# Patient Record
Sex: Male | Born: 1960 | Race: White | Hispanic: No | Marital: Married | Smoking: Never smoker
Health system: Southern US, Community
[De-identification: ages and names within clinical notes are randomized; demographics above are authoritative.]

## PROBLEM LIST (undated history)

## (undated) DIAGNOSIS — H269 Unspecified cataract: Secondary | ICD-10-CM

## (undated) DIAGNOSIS — R6889 Other general symptoms and signs: Secondary | ICD-10-CM

## (undated) DIAGNOSIS — T887XXA Unspecified adverse effect of drug or medicament, initial encounter: Secondary | ICD-10-CM

## (undated) DIAGNOSIS — E785 Hyperlipidemia, unspecified: Secondary | ICD-10-CM

## (undated) DIAGNOSIS — R7402 Elevation of levels of lactic acid dehydrogenase (LDH): Secondary | ICD-10-CM

## (undated) DIAGNOSIS — K209 Esophagitis, unspecified without bleeding: Secondary | ICD-10-CM

## (undated) DIAGNOSIS — K7689 Other specified diseases of liver: Secondary | ICD-10-CM

## (undated) DIAGNOSIS — K219 Gastro-esophageal reflux disease without esophagitis: Secondary | ICD-10-CM

## (undated) DIAGNOSIS — R740 Nonspecific elevation of levels of transaminase and lactic acid dehydrogenase [LDH]: Secondary | ICD-10-CM

## (undated) DIAGNOSIS — K449 Diaphragmatic hernia without obstruction or gangrene: Secondary | ICD-10-CM

## (undated) DIAGNOSIS — K5792 Diverticulitis of intestine, part unspecified, without perforation or abscess without bleeding: Secondary | ICD-10-CM

## (undated) DIAGNOSIS — L259 Unspecified contact dermatitis, unspecified cause: Secondary | ICD-10-CM

## (undated) DIAGNOSIS — K644 Residual hemorrhoidal skin tags: Secondary | ICD-10-CM

## (undated) DIAGNOSIS — R7401 Elevation of levels of liver transaminase levels: Secondary | ICD-10-CM

## (undated) HISTORY — DX: Residual hemorrhoidal skin tags: K64.4

## (undated) HISTORY — DX: Unspecified cataract: H26.9

## (undated) HISTORY — DX: Hyperlipidemia, unspecified: E78.5

## (undated) HISTORY — DX: Diaphragmatic hernia without obstruction or gangrene: K44.9

## (undated) HISTORY — DX: Elevation of levels of liver transaminase levels: R74.01

## (undated) HISTORY — DX: Other general symptoms and signs: R68.89

## (undated) HISTORY — PX: COLONOSCOPY: SHX174

## (undated) HISTORY — DX: Elevation of levels of lactic acid dehydrogenase (LDH): R74.02

## (undated) HISTORY — DX: Unspecified contact dermatitis, unspecified cause: L25.9

## (undated) HISTORY — DX: Gastro-esophageal reflux disease without esophagitis: K21.9

## (undated) HISTORY — DX: Unspecified adverse effect of drug or medicament, initial encounter: T88.7XXA

## (undated) HISTORY — DX: Esophagitis, unspecified: K20.9

## (undated) HISTORY — PX: CATARACT EXTRACTION, BILATERAL: SHX1313

## (undated) HISTORY — DX: Esophagitis, unspecified without bleeding: K20.90

## (undated) HISTORY — DX: Other specified diseases of liver: K76.89

## (undated) HISTORY — PX: WRIST SURGERY: SHX841

## (undated) HISTORY — DX: Nonspecific elevation of levels of transaminase and lactic acid dehydrogenase (ldh): R74.0

---

## 2000-12-24 ENCOUNTER — Other Ambulatory Visit: Admission: RE | Admit: 2000-12-24 | Discharge: 2000-12-24 | Payer: Self-pay | Admitting: Gastroenterology

## 2000-12-24 ENCOUNTER — Encounter (INDEPENDENT_AMBULATORY_CARE_PROVIDER_SITE_OTHER): Payer: Self-pay | Admitting: *Deleted

## 2002-12-20 ENCOUNTER — Inpatient Hospital Stay (HOSPITAL_COMMUNITY): Admission: EM | Admit: 2002-12-20 | Discharge: 2002-12-22 | Payer: Self-pay | Admitting: Emergency Medicine

## 2002-12-20 ENCOUNTER — Encounter: Payer: Self-pay | Admitting: *Deleted

## 2004-07-10 ENCOUNTER — Ambulatory Visit: Payer: Self-pay | Admitting: Gastroenterology

## 2005-08-31 ENCOUNTER — Ambulatory Visit: Payer: Self-pay | Admitting: Internal Medicine

## 2005-09-04 ENCOUNTER — Ambulatory Visit: Payer: Self-pay | Admitting: Internal Medicine

## 2006-01-18 ENCOUNTER — Ambulatory Visit: Payer: Self-pay | Admitting: Gastroenterology

## 2006-02-07 ENCOUNTER — Ambulatory Visit: Payer: Self-pay | Admitting: Gastroenterology

## 2006-02-07 ENCOUNTER — Encounter (INDEPENDENT_AMBULATORY_CARE_PROVIDER_SITE_OTHER): Payer: Self-pay | Admitting: *Deleted

## 2006-07-03 ENCOUNTER — Ambulatory Visit (HOSPITAL_COMMUNITY): Admission: EM | Admit: 2006-07-03 | Discharge: 2006-07-03 | Payer: Self-pay | Admitting: Emergency Medicine

## 2006-10-04 ENCOUNTER — Ambulatory Visit: Payer: Self-pay | Admitting: Gastroenterology

## 2006-10-16 ENCOUNTER — Ambulatory Visit: Payer: Self-pay | Admitting: Internal Medicine

## 2006-10-16 LAB — CONVERTED CEMR LAB
Albumin: 3.8 g/dL (ref 3.5–5.2)
Basophils Absolute: 0 10*3/uL (ref 0.0–0.1)
Bilirubin Urine: NEGATIVE
Cholesterol: 282 mg/dL (ref 0–200)
Creatinine, Ser: 1.2 mg/dL (ref 0.4–1.5)
Crystals: NEGATIVE
Direct LDL: 189.3 mg/dL
Eosinophils Absolute: 0.1 10*3/uL (ref 0.0–0.6)
HDL: 37.4 mg/dL — ABNORMAL LOW (ref >39.0)
Hemoglobin: 15.3 g/dL (ref 13.0–17.0)
Ketones, ur: NEGATIVE mg/dL
Leukocytes, UA: NEGATIVE
Lymphocytes Relative: 32.1 % (ref 12.0–46.0)
MCHC: 34.4 g/dL (ref 30.0–36.0)
MCV: 87 fL (ref 78.0–100.0)
Monocytes Absolute: 0.8 10*3/uL — ABNORMAL HIGH (ref 0.2–0.7)
Monocytes Relative: 14.5 % — ABNORMAL HIGH (ref 3.0–11.0)
Neutro Abs: 2.8 10*3/uL (ref 1.4–7.7)
Nitrite: NEGATIVE
Platelets: 200 10*3/uL (ref 150–400)
Potassium: 3.9 meq/L (ref 3.5–5.1)
Sodium: 139 meq/L (ref 135–145)
Specific Gravity, Urine: 1.025 (ref 1.000–1.03)
TSH: 2.05 microintl units/mL (ref 0.35–5.50)
Total Bilirubin: 0.9 mg/dL (ref 0.3–1.2)
Total Protein: 6.5 g/dL (ref 6.0–8.3)
Urine Glucose: NEGATIVE mg/dL
Urobilinogen, UA: 0.2 (ref 0.0–1.0)

## 2006-10-22 ENCOUNTER — Ambulatory Visit: Payer: Self-pay | Admitting: Internal Medicine

## 2006-11-24 ENCOUNTER — Emergency Department (HOSPITAL_COMMUNITY): Admission: EM | Admit: 2006-11-24 | Discharge: 2006-11-24 | Payer: Self-pay | Admitting: Emergency Medicine

## 2007-02-03 ENCOUNTER — Encounter: Payer: Self-pay | Admitting: Internal Medicine

## 2007-02-03 DIAGNOSIS — E785 Hyperlipidemia, unspecified: Secondary | ICD-10-CM | POA: Insufficient documentation

## 2007-04-02 ENCOUNTER — Ambulatory Visit: Payer: Self-pay

## 2007-04-02 ENCOUNTER — Encounter: Payer: Self-pay | Admitting: Internal Medicine

## 2007-05-08 HISTORY — PX: LEG SURGERY: SHX1003

## 2007-12-29 DIAGNOSIS — K449 Diaphragmatic hernia without obstruction or gangrene: Secondary | ICD-10-CM | POA: Insufficient documentation

## 2007-12-29 DIAGNOSIS — K644 Residual hemorrhoidal skin tags: Secondary | ICD-10-CM | POA: Insufficient documentation

## 2007-12-29 DIAGNOSIS — K219 Gastro-esophageal reflux disease without esophagitis: Secondary | ICD-10-CM | POA: Insufficient documentation

## 2007-12-30 ENCOUNTER — Ambulatory Visit: Payer: Self-pay | Admitting: Gastroenterology

## 2007-12-30 DIAGNOSIS — T887XXA Unspecified adverse effect of drug or medicament, initial encounter: Secondary | ICD-10-CM | POA: Insufficient documentation

## 2007-12-30 DIAGNOSIS — R740 Nonspecific elevation of levels of transaminase and lactic acid dehydrogenase [LDH]: Secondary | ICD-10-CM

## 2007-12-30 DIAGNOSIS — R7401 Elevation of levels of liver transaminase levels: Secondary | ICD-10-CM | POA: Insufficient documentation

## 2007-12-30 LAB — CONVERTED CEMR LAB
Ceruloplasmin: 37 mg/dL (ref 21–63)
HCV Ab: NEGATIVE

## 2007-12-31 ENCOUNTER — Encounter: Payer: Self-pay | Admitting: Gastroenterology

## 2008-01-01 ENCOUNTER — Ambulatory Visit (HOSPITAL_COMMUNITY): Admission: RE | Admit: 2008-01-01 | Discharge: 2008-01-01 | Payer: Self-pay | Admitting: Gastroenterology

## 2008-04-06 LAB — CONVERTED CEMR LAB
AST: 28 units/L (ref 0–37)
Alkaline Phosphatase: 59 units/L (ref 39–117)
Ammonia: 16 umol/L (ref 11–35)
Basophils Absolute: 0 10*3/uL (ref 0.0–0.1)
Chloride: 105 meq/L (ref 96–112)
Eosinophils Absolute: 0.1 10*3/uL (ref 0.0–0.7)
GFR calc non Af Amer: 69 mL/min
MCHC: 35 g/dL (ref 30.0–36.0)
MCV: 88.8 fL (ref 78.0–100.0)
Neutrophils Relative %: 55 % (ref 43.0–77.0)
Platelets: 243 10*3/uL (ref 150–400)
Potassium: 4.1 meq/L (ref 3.5–5.1)
Sodium: 141 meq/L (ref 135–145)
Total Bilirubin: 1.2 mg/dL (ref 0.3–1.2)
WBC: 5.8 10*3/uL (ref 4.5–10.5)

## 2008-04-19 ENCOUNTER — Ambulatory Visit: Payer: Self-pay | Admitting: Gastroenterology

## 2008-04-19 LAB — CONVERTED CEMR LAB
Bilirubin, Direct: 0.1 mg/dL (ref 0.0–0.3)
Total Bilirubin: 0.9 mg/dL (ref 0.3–1.2)
Total Protein: 6.8 g/dL (ref 6.0–8.3)

## 2008-04-22 ENCOUNTER — Ambulatory Visit: Payer: Self-pay | Admitting: Gastroenterology

## 2008-04-22 DIAGNOSIS — L259 Unspecified contact dermatitis, unspecified cause: Secondary | ICD-10-CM | POA: Insufficient documentation

## 2008-04-22 DIAGNOSIS — K7689 Other specified diseases of liver: Secondary | ICD-10-CM | POA: Insufficient documentation

## 2008-04-22 LAB — CONVERTED CEMR LAB
PSA: 3.13 ng/mL (ref 0.10–4.00)
Sex Hormone Binding: 23 nmol/L (ref 13–71)
Testosterone: 306.8 ng/dL — ABNORMAL LOW (ref 350–890)
Tissue Transglutaminase Ab, IgA: 0 units (ref <7)

## 2008-05-11 ENCOUNTER — Encounter: Payer: Self-pay | Admitting: Gastroenterology

## 2008-05-14 ENCOUNTER — Encounter: Payer: Self-pay | Admitting: Gastroenterology

## 2008-05-14 ENCOUNTER — Ambulatory Visit: Payer: Self-pay | Admitting: Gastroenterology

## 2008-05-17 ENCOUNTER — Telehealth (INDEPENDENT_AMBULATORY_CARE_PROVIDER_SITE_OTHER): Payer: Self-pay | Admitting: *Deleted

## 2008-05-19 ENCOUNTER — Encounter: Payer: Self-pay | Admitting: Gastroenterology

## 2008-06-15 ENCOUNTER — Ambulatory Visit: Payer: Self-pay | Admitting: Gastroenterology

## 2008-06-15 DIAGNOSIS — R6889 Other general symptoms and signs: Secondary | ICD-10-CM

## 2008-07-06 ENCOUNTER — Encounter: Admission: RE | Admit: 2008-07-06 | Discharge: 2008-07-06 | Payer: Self-pay | Admitting: Orthopedic Surgery

## 2008-07-16 ENCOUNTER — Inpatient Hospital Stay (HOSPITAL_COMMUNITY): Admission: RE | Admit: 2008-07-16 | Discharge: 2008-07-18 | Payer: Self-pay | Admitting: Orthopedic Surgery

## 2008-12-21 ENCOUNTER — Ambulatory Visit: Payer: Self-pay | Admitting: Gastroenterology

## 2008-12-21 LAB — CONVERTED CEMR LAB
ALT: 41 units/L (ref 0–53)
Albumin: 3.7 g/dL (ref 3.5–5.2)
Alkaline Phosphatase: 67 units/L (ref 39–117)
Total Protein: 6.7 g/dL (ref 6.0–8.3)

## 2009-11-21 ENCOUNTER — Ambulatory Visit: Payer: Self-pay | Admitting: Internal Medicine

## 2009-11-22 LAB — CONVERTED CEMR LAB
Albumin: 4 g/dL (ref 3.5–5.2)
Basophils Absolute: 0 10*3/uL (ref 0.0–0.1)
Calcium: 9.3 mg/dL (ref 8.4–10.5)
Chloride: 106 meq/L (ref 96–112)
Cholesterol: 243 mg/dL — ABNORMAL HIGH (ref 0–200)
Creatinine, Ser: 1.2 mg/dL (ref 0.4–1.5)
Direct LDL: 170 mg/dL
Eosinophils Absolute: 0.1 10*3/uL (ref 0.0–0.7)
GFR calc non Af Amer: 69.45 mL/min (ref >60)
HCT: 43.4 % (ref 39.0–52.0)
Hemoglobin: 15.2 g/dL (ref 13.0–17.0)
Leukocytes, UA: NEGATIVE
Lymphs Abs: 1.7 10*3/uL (ref 0.7–4.0)
MCHC: 35 g/dL (ref 30.0–36.0)
MCV: 88.3 fL (ref 78.0–100.0)
Monocytes Absolute: 0.7 10*3/uL (ref 0.1–1.0)
Neutro Abs: 3.1 10*3/uL (ref 1.4–7.7)
Nitrite: NEGATIVE
Platelets: 198 10*3/uL (ref 150.0–400.0)
RDW: 13 % (ref 11.5–14.6)
Specific Gravity, Urine: 1.025 (ref 1.000–1.030)
TSH: 2.05 microintl units/mL (ref 0.35–5.50)
Total Bilirubin: 0.6 mg/dL (ref 0.3–1.2)
Total CHOL/HDL Ratio: 6
Triglycerides: 223 mg/dL — ABNORMAL HIGH (ref 0.0–149.0)
Urobilinogen, UA: 0.2 (ref 0.0–1.0)
pH: 6 (ref 5.0–8.0)

## 2009-11-28 ENCOUNTER — Ambulatory Visit: Payer: Self-pay | Admitting: Internal Medicine

## 2009-11-28 DIAGNOSIS — R972 Elevated prostate specific antigen [PSA]: Secondary | ICD-10-CM

## 2010-01-27 ENCOUNTER — Ambulatory Visit: Payer: Self-pay | Admitting: Internal Medicine

## 2010-04-25 ENCOUNTER — Ambulatory Visit: Payer: Self-pay | Admitting: Gastroenterology

## 2010-05-07 DIAGNOSIS — K5792 Diverticulitis of intestine, part unspecified, without perforation or abscess without bleeding: Secondary | ICD-10-CM

## 2010-05-07 HISTORY — DX: Diverticulitis of intestine, part unspecified, without perforation or abscess without bleeding: K57.92

## 2010-06-04 LAB — CONVERTED CEMR LAB
PSA, Free Pct: 25 (ref >25)
PSA: 2.85 ng/mL (ref 0.10–4.00)

## 2010-06-06 NOTE — Assessment & Plan Note (Signed)
Summary: cpx-lb   Vital Signs:  Patient profile:   50 year old male Height:      67 inches Weight:      198 pounds BMI:     31.12 O2 Sat:      96 % on Room air Temp:     98.6 degrees F oral Pulse rate:   72 / minute Pulse rhythm:   regular Resp:     16 per minute BP sitting:   130 / 80  (left arm) Cuff size:   regular  Vitals Entered By: Lanier Prude, CMA(AAMA) (November 28, 2009 2:05 PM)  O2 Flow:  Room air CC: CPX Is Patient Diabetic? No   Primary Care Provider:  Sonda Primes, MD  CC:  CPX.  History of Present Illness: The patient presents for a wellness examination The patient presents for a follow up of hyperlipidemia, fatty liver, abn tests    Current Medications (verified): 1)  Nexium 40 Mg  Cpdr (Esomeprazole Magnesium) .... Take 1 Tablet By Mouth Once A Day 2)  Baby Aspirin 81 Mg  Chew (Aspirin) .... Take 4 Capsules Once Daily 3)  Citrucel 500 Mg  Tabs (Methylcellulose (Laxative)) .... Once Daily 4)  Align   Caps (Misc Intestinal Flora Regulat) .... Take One Capsule By Mouth Daily  Allergies (verified): 1)  ! Penicillin V Potassium (Penicillin V Potassium) 2)  ! Zocor (Simvastatin) 3)  ! Niacin 4)  ! Zetia (Ezetimibe) 5)  ! Welchol (Colesevelam Hcl) 6)  ! 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  Alcohol Use - yes-rare Daily Caffeine Use-1-2 cups daily Illicit Drug Use - no Patient does not get regular exercise.  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  Alcohol Use - yes-rare Daily Caffeine Use-1-2 cups daily Illicit Drug Use - no Patient does not get regular exercise.  Married 2 children  Review of Systems  The patient denies anorexia, fever, weight loss, weight gain, vision loss, decreased hearing, hoarseness, chest pain, syncope, dyspnea on exertion, peripheral edema, prolonged cough, headaches, hemoptysis, abdominal pain, melena, hematochezia, severe indigestion/heartburn, hematuria, incontinence, genital sores, muscle weakness, suspicious skin lesions, transient blindness, difficulty walking, depression, unusual weight change, abnormal bleeding, enlarged lymph nodes, angioedema, and testicular masses.    Physical Exam  General:  Well developed, well nourished, no acute distress.healthy appearing.   Head:  Normocephalic and atraumatic without obvious abnormalities. No apparent alopecia or balding. Eyes:  No corneal or conjunctival inflammation noted. EOMI. Perrla.  Ears:  External ear exam shows no significant lesions or deformities.  Otoscopic examination reveals clear canals, tympanic membranes are intact bilaterally without bulging, retraction, inflammation or discharge. Hearing is grossly normal bilaterally. Nose:  External nasal examination shows no deformity or inflammation. Nasal mucosa are pink and moist without lesions or exudates. Mouth:  Oral mucosa and oropharynx without lesions or exudates.  Teeth in good repair. Neck:  Supple; no masses or thyromegaly.  Chest Wall:  No deformities, masses, tenderness or gynecomastia noted. Lungs:  Normal respiratory effort, chest expands symmetrically. Lungs are clear to auscultation, no  crackles or wheezes. Heart:  Normal rate and regular rhythm. S1 and S2 normal without gallop, murmur, click, rub or other extra sounds. Abdomen:  Bowel sounds positive,abdomen soft and non-tender without masses, organomegaly or hernias noted. Rectal:  No external abnormalities noted. Normal sphincter tone. No rectal masses or tenderness. Genitalia:  Testes bilaterally descended without nodularity, tenderness or masses. No scrotal masses or lesions. No penis lesions or urethral discharge. Prostate:  Prostate gland firm and smooth, no enlargement, nodularity, tenderness, mass, asymmetry or induration. Msk:  No deformity or scoliosis noted of thoracic or lumbar spine.   Pulses:  R and L carotid,radial,femoral,dorsalis pedis and posterior tibial pulses are full and equal bilaterally Extremities:  No clubbing, cyanosis, edema, or deformity noted with normal full range of motion of all joints.   Neurologic:  No cranial nerve deficits noted. Station and gait are normal. Plantar reflexes are down-going bilaterally. DTRs are symmetrical throughout. Sensory, motor and coordinative functions appear intact. Skin:  Intact without suspicious lesions or rashes Cervical Nodes:  No lymphadenopathy noted Inguinal Nodes:  No significant adenopathy Psych:  Cognition and judgment appear intact. Alert and cooperative with normal attention span and concentration. No apparent delusions, illusions, hallucinations   Impression & Recommendations:  Problem # 1:  PHYSICAL EXAMINATION (ICD-V70.0) Assessment New Health and age related issues were discussed. Available screening tests and vaccinations were discussed as well. Healthy life style including good diet and execise was discussed.  The labs were reviewed with the patient.  EKG ok Colon due 2013  Problem # 2:  PSA, INCREASED (ICD-790.93) Assessment: New Discussed. Start antibiotic.Marland Kitchen Recheck in 3 months with  a free PSA   Problem # 3:  FATTY LIVER DISEASE  (ICD-571.8) Assessment: Comment Only Loosse wt  Problem # 4:  GERD (ICD-530.81) Assessment: Improved  His updated medication list for this problem includes:    Nexium 40 Mg Cpdr (Esomeprazole magnesium) .Marland Kitchen... Take 1 tablet by mouth once a day  Problem # 5:  HYPERLIPIDEMIA (ICD-272.4) Assessment: Comment Only NMR Statin intol. Labs Reviewed: SGOT: 26 (11/21/2009)   SGPT: 48 (11/21/2009)   HDL:39.10 (11/21/2009), 37.4 (10/16/2006)  LDL:DEL (10/16/2006)  Chol:243 (11/21/2009), 282 (10/16/2006)  Trig:223.0 (11/21/2009), 127 (10/16/2006)  Complete Medication List: 1)  Nexium 40 Mg Cpdr (Esomeprazole magnesium) .... Take 1 tablet by mouth once a day 2)  Citrucel 500 Mg Tabs (Methylcellulose (laxative)) .... Once daily 3)  Align Caps (Misc intestinal flora regulat) .... Take one capsule by mouth daily 4)  Vitamin D 1000 Unit Tabs (Cholecalciferol) .Marland Kitchen.. 1 by mouth qd 5)  Ciprofloxacin Hcl 500 Mg Tabs (Ciprofloxacin hcl) .Marland Kitchen.. 1 by mouth bid 6)  Aspirin 325 Mg Tabs (Aspirin) .Marland Kitchen.. 1 by mouth once daily pc 7)  Triamcinolone Acetonide 0.5 % Crea (Triamcinolone acetonide) .... Use two times a day prn  Other Orders: EKG w/ Interpretation (93000)  Patient Instructions: 1)  Please schedule a follow-up appointment in 2-3 months. 2)  PSA prior to visit, ICD-9 and free PSA  995.20 3)  NMR lipid profile 272.0 4)  Try to eat more raw plant food, fresh and dry fruit, raw almonds, leafy vegetables, whole foods and less red meat, less animal fat. Poultry and fish is better for you than pork and beef. Avoid processed foods (canned soups, hot dogs, sausage, bacon , frozen dinners). Avoid corn syrup, high fructose syrup or  aspartam  containing drinks. Honey, Agave and Stevia are better sweeteners. Make your own  dressing with olive oil, wine vinegar, lemon juce, garlic etc. for your salads.  5)  Please schedule a follow-up appointment in 1 year well w/labs. Prescriptions: TRIAMCINOLONE ACETONIDE 0.5 % CREA  (TRIAMCINOLONE ACETONIDE) use two times a day prn  #120 g x 3   Entered and Authorized by:   Tresa Garter MD   Signed by:   Tresa Garter MD on 11/28/2009   Method used:   Electronically to        [facility] # [phone]* (retail)       [address]       [place], [place]  [address]       Ph: [phone]       Fax: [phone]   RxID:   [id number] CIPROFLOXACIN HCL 500 MG TABS (CIPROFLOXACIN HCL) 1 by mouth bid  #28 x 0   Entered and Authorized by:   Tresa Garter MD   Signed by:   Tresa Garter MD on 11/28/2009   Method used:   Electronically to        [facility] # [phone]* (retail)       [address]       [place], [place]  [address]       Ph: [phone]       Fax: [phone]   RxID:   [id number]

## 2010-06-08 NOTE — Assessment & Plan Note (Signed)
Summary: REFLUX.Marland KitchenJJ.    History of Present Illness Visit Type: Follow-up Visit Primary GI MD: Sheryn Bison MD FACP FAGA Primary Gianny Sabino: Sonda Primes, MD Chief Complaint: GERD, lump in throat & dysphagia x 30  days History of Present Illness:   Very Pleasant 50 year old Caucasian male with a rather typical globus sensation his retropharyngeal area of the last 6 weeks unrelieved by Nexium 40 mg a day which she's been taking for several years.Previous endoscopic exam in January 2010 showed a small hiatal hernia and resolving esophagitis. Examination for H. pylori was negative.  He denies dysphagia,anorexia, weight loss, postnasal drip, allergies, cough, asthma, frequent antibiotic use. He denies any lower gastrointestinal hepatobiliary complaints. He is on daily aspirin therapy.   GI Review of Systems    Reports acid reflux and  dysphagia with solids.      Denies abdominal pain, belching, bloating, chest pain, dysphagia with liquids, heartburn, loss of appetite, nausea, vomiting, vomiting blood, weight loss, and  weight gain.        Denies anal fissure, black tarry stools, change in bowel habit, constipation, diarrhea, diverticulosis, fecal incontinence, heme positive stool, hemorrhoids, irritable bowel syndrome, jaundice, light color stool, liver problems, rectal bleeding, and  rectal pain.    Current Medications (verified): 1)  Nexium 40 Mg  Cpdr (Esomeprazole Magnesium) .... Take 1 Tablet By Mouth Once A Day 2)  Citrucel 500 Mg  Tabs (Methylcellulose (Laxative)) .... Once Daily 3)  Aspirin 325 Mg Tabs (Aspirin) .Marland Kitchen.. 1 By Mouth Once Daily Pc 4)  Triamcinolone Acetonide 0.5 % Crea (Triamcinolone Acetonide) .... Use Two Times A Day Prn  Allergies (verified): 1)  ! Penicillin V Potassium (Penicillin V Potassium) 2)  ! Zocor (Simvastatin) 3)  ! Niacin 4)  ! Zetia (Ezetimibe) 5)  ! Welchol (Colesevelam Hcl) 6)  ! Prilosec  Past History:  Past medical, surgical, family and  social histories (including risk factors) reviewed for relevance to current acute and chronic problems.  Past Medical History: Reviewed history from 11/28/2009 and no changes required. Current Problems:  OTHER SYMPTOMS INVOLVING HEAD AND NECK (ICD-784.99) CONTACT DERMATITIS&OTHER ECZEMA DUE UNSPEC CAUSE (ICD-692.9) FATTY LIVER DISEASE (ICD-571.8) ADVERSE DRUG REACTION (ICD-995.20) - statin intolerant NONSPEC ELEVATION OF LEVELS OF TRANSAMINASE/LDH (ICD-790.4) GERD (ICD-530.81) HIATAL HERNIA (ICD-553.3) EXTERNAL HEMORRHOIDS (ICD-455.3) HYPERLIPIDEMIA (ICD-272.4)  Past Surgical History: surgery with metal plate left wrist and index finger Broken Leg  Family History: Reviewed history from 12/30/2007 and no changes required. No FH of Colon Cancer: Family History of Diabetes: Father, Paternal Emelia Loron, Paternal Elio Forget  Social History: Reviewed history from 11/28/2009 and no changes required. 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  Alcohol Use - yes-rare Daily Caffeine Use-1-2 cups daily Illicit Drug Use - no Patient does not get regular exercise.  Married 2 children  Review of Systems  The patient denies allergy/sinus, anemia, anxiety-new, arthritis/joint pain, back pain, blood in urine, breast changes/lumps, change in vision, confusion, cough, coughing up blood, depression-new, fainting, fatigue, fever, headaches-new, hearing problems, heart murmur, heart rhythm changes, itching, menstrual pain, muscle pains/cramps, night sweats, nosebleeds, pregnancy symptoms, shortness of breath, skin rash, sleeping problems, sore throat, swelling of feet/legs, swollen lymph glands, thirst - excessive , urination - excessive , urination changes/pain, urine leakage, vision changes, and voice change.    Vital Signs:  Patient profile:   50 year old male Height:      67 inches Weight:      198.13 pounds BMI:     31.14 Pulse rate:   72 /  minute Pulse rhythm:   regular BP  sitting:   100 / 70  (left arm) Cuff size:   regular  Vitals Entered By: June McMurray CMA Duncan Dull) (April 25, 2010 3:06 PM)  Physical Exam  General:  Well developed, well nourished, no acute distress.healthy appearing.   Head:  Normocephalic and atraumatic. Eyes:  PERRLA, no icterus.exam deferred to patient's ophthalmologist.   Mouth:  No deformity or lesions, dentition normal. Neck:  Supple; no masses or thyromegaly. Lungs:  Clear throughout to auscultation. Heart:  Regular rate and rhythm; no murmurs, rubs,  or bruits. Abdomen:  Soft, nontender and nondistended. No masses, hepatosplenomegaly or hernias noted. Normal bowel sounds. Psych:  Alert and cooperative. Normal mood and affect.   Impression & Recommendations:  Problem # 1:  GERD (ICD-530.81) Assessment Deteriorated Extra esophageal manifestation of acid reflux with rather typical globus sensation. He denies any ENT problems or psychiatric difficulties. I placed him on Nexium 40 mg twice a day with p.r.n. sublingual Levsin. We'll long discussion concerning acid reflux and atypical presentations of this condition. I will see him back in 6 weeks' time for followup and consider esophageal manometry and 24-hour pH probe testing depending on his clinical course. He of course is to follow standard antireflux maneuvers. There is nothing in his history  to suggest eosinophilic esophagitis.  Problem # 2:  FATTY LIVER DISEASE (ICD-571.8) Assessment: Improved  Rrepeat liver function tests were normal and there is no evidence of hepatosplenomegaly on physical exam or stigmata of chronic liver disease.  Patient Instructions: 1)  Please pick up your prescriptions at the pharmacy. Electronic prescription(s) has already been sent for Nexium two times a day dosing and Levsin every 6-8 hours as needed. 2)  Please schedule a follow-up appointment in 6 weeks.  3)  Copy sent to : Dr A Plotnikov 4)  The medication list was reviewed and  reconciled.  All changed / newly prescribed medications were explained.  A complete medication list was provided to the patient / caregiver. Prescriptions: LEVSIN 0.125 MG TABS (HYOSCYAMINE SULFATE) Take 1 tablet by mouth every 6-8 hours as needed  #30 x 2   Entered by:   Lamona Curl CMA (AAMA)   Authorized by:   Mardella Layman MD [facility]   Signed by:   Lamona Curl CMA (AAMA) on 04/25/2010   Method used:   Electronically to        [facility] # [phone]* (retail)       [address]       [place], [place]  [address]       Ph: [phone]       Fax: [phone]   RxID:   [id number] NEXIUM 40 MG  CPDR (ESOMEPRAZOLE MAGNESIUM) Take 1 tablet by mouth two times a day (30 minutes before breakfast, 30 minutes before dinner).  #60 x 2   Entered by:   Lamona Curl CMA (AAMA)   Authorized by:   Mardella Layman MD [facility]   Signed by:   Lamona Curl CMA (AAMA) on 04/25/2010   Method used:   Electronically to        [facility] # [phone]* (retail)       [address]       [place], [place]  [address]       Ph: [phone]       Fax: [phone]   RxID:   [id number]

## 2010-08-17 LAB — DIFFERENTIAL
Basophils Absolute: 0 10*3/uL (ref 0.0–0.1)
Basophils Relative: 1 % (ref 0–1)
Lymphocytes Relative: 19 % (ref 12–46)
Neutro Abs: 4.6 10*3/uL (ref 1.7–7.7)
Neutrophils Relative %: 68 % (ref 43–77)

## 2010-08-17 LAB — URINALYSIS, ROUTINE W REFLEX MICROSCOPIC
Glucose, UA: NEGATIVE mg/dL
Ketones, ur: NEGATIVE mg/dL
Nitrite: NEGATIVE
Protein, ur: NEGATIVE mg/dL
Urobilinogen, UA: 0.2 mg/dL (ref 0.0–1.0)

## 2010-08-17 LAB — BASIC METABOLIC PANEL
BUN: 14 mg/dL (ref 6–23)
Chloride: 98 mEq/L (ref 96–112)
Chloride: 99 mEq/L (ref 96–112)
Creatinine, Ser: 1.11 mg/dL (ref 0.4–1.5)
GFR calc non Af Amer: >60 mL/min (ref >60)
Glucose, Bld: 130 mg/dL — ABNORMAL HIGH (ref 70–99)
Glucose, Bld: 143 mg/dL — ABNORMAL HIGH (ref 70–99)
Potassium: 3.4 mEq/L — ABNORMAL LOW (ref 3.5–5.1)
Potassium: 4.4 mEq/L (ref 3.5–5.1)
Sodium: 137 mEq/L (ref 135–145)

## 2010-08-17 LAB — COMPREHENSIVE METABOLIC PANEL
Albumin: 3.6 g/dL (ref 3.5–5.2)
Alkaline Phosphatase: 77 U/L (ref 39–117)
BUN: 17 mg/dL (ref 6–23)
Chloride: 101 mEq/L (ref 96–112)
Creatinine, Ser: 1.08 mg/dL (ref 0.4–1.5)
GFR calc non Af Amer: >60 mL/min (ref >60)
Glucose, Bld: 95 mg/dL (ref 70–99)
Potassium: 4.1 mEq/L (ref 3.5–5.1)
Total Bilirubin: 1 mg/dL (ref 0.3–1.2)

## 2010-08-17 LAB — PROTIME-INR
INR: 1 (ref 0.00–1.49)
Prothrombin Time: 13.2 seconds (ref 11.6–15.2)

## 2010-08-17 LAB — CBC
HCT: 30.8 % — ABNORMAL LOW (ref 39.0–52.0)
HCT: 33.3 % — ABNORMAL LOW (ref 39.0–52.0)
HCT: 38.5 % — ABNORMAL LOW (ref 39.0–52.0)
Hemoglobin: 11.6 g/dL — ABNORMAL LOW (ref 13.0–17.0)
Hemoglobin: 13.6 g/dL (ref 13.0–17.0)
MCV: 87.1 fL (ref 78.0–100.0)
MCV: 88.3 fL (ref 78.0–100.0)
MCV: 88.8 fL (ref 78.0–100.0)
Platelets: 295 10*3/uL (ref 150–400)
Platelets: 336 10*3/uL (ref 150–400)
RDW: 12.5 % (ref 11.5–15.5)
RDW: 12.6 % (ref 11.5–15.5)
WBC: 6.8 10*3/uL (ref 4.0–10.5)

## 2010-08-17 LAB — URINE CULTURE

## 2010-08-22 ENCOUNTER — Ambulatory Visit (INDEPENDENT_AMBULATORY_CARE_PROVIDER_SITE_OTHER): Payer: BLUE CROSS/BLUE SHIELD | Admitting: Gastroenterology

## 2010-08-22 ENCOUNTER — Other Ambulatory Visit: Payer: Self-pay | Admitting: Internal Medicine

## 2010-08-22 ENCOUNTER — Other Ambulatory Visit (INDEPENDENT_AMBULATORY_CARE_PROVIDER_SITE_OTHER): Payer: BLUE CROSS/BLUE SHIELD | Admitting: Internal Medicine

## 2010-08-22 ENCOUNTER — Encounter: Payer: Self-pay | Admitting: Gastroenterology

## 2010-08-22 ENCOUNTER — Other Ambulatory Visit (INDEPENDENT_AMBULATORY_CARE_PROVIDER_SITE_OTHER): Payer: BLUE CROSS/BLUE SHIELD

## 2010-08-22 VITALS — BP 124/68 | HR 60 | Ht 67.0 in | Wt 200.0 lb

## 2010-08-22 DIAGNOSIS — E785 Hyperlipidemia, unspecified: Secondary | ICD-10-CM

## 2010-08-22 DIAGNOSIS — R1310 Dysphagia, unspecified: Secondary | ICD-10-CM

## 2010-08-22 DIAGNOSIS — Z13220 Encounter for screening for lipoid disorders: Secondary | ICD-10-CM

## 2010-08-22 DIAGNOSIS — R972 Elevated prostate specific antigen [PSA]: Secondary | ICD-10-CM

## 2010-08-22 LAB — LDL CHOLESTEROL, DIRECT: Direct LDL: 185.3 mg/dL

## 2010-08-22 LAB — LIPID PANEL
Cholesterol: 266 mg/dL — ABNORMAL HIGH (ref 0–200)
Total CHOL/HDL Ratio: 6
VLDL: 42.4 mg/dL — ABNORMAL HIGH (ref 0.0–40.0)

## 2010-08-22 NOTE — Patient Instructions (Signed)
Your Barium Swallow is scheduled for 08/24/2010 please arrive at 10:45am [facility]. Have nothing to eat or drink after midnight.

## 2010-08-22 NOTE — Progress Notes (Signed)
This is a 50 year old Caucasian male who has chronic GERD managed with Nexium 40 mg twice a day. He now presents with dysphagia for solid foods and his retropharyngeal area. He does describe a globus sensation and frequent throat clearing , denies voice changes, hoarseness, masses, or abdominal or chest pain.  Current Medications, Allergies, Past Medical History, Past Surgical History, Family History and Social History were reviewed in [employer] record.  Pertinent Review of Systems Negative   Physical Exam: Examination oral pharyngeal area, neck, and chest are unremarkable. No thyromegaly or lymphadenopathy.    Assessment and Plan: Dysphagia unexplained etiology with negative endoscopy approximately one year ago. I've scheduled him for barium swallow with pill exam also. He may need repeat endoscopy empiric dilation versus esophageal manometry. For now, we will continue twice a day PPI therapy. Encounter Diagnosis  Name Primary?  Marland Kitchen Dysphagia, unspecified Yes

## 2010-08-23 ENCOUNTER — Telehealth: Payer: Self-pay | Admitting: Internal Medicine

## 2010-08-23 DIAGNOSIS — R972 Elevated prostate specific antigen [PSA]: Secondary | ICD-10-CM

## 2010-08-23 LAB — PSA, TOTAL AND FREE
PSA, Free Pct: 17 % — ABNORMAL LOW (ref >25)
PSA, Free: 0.8 ng/mL

## 2010-08-23 NOTE — Telephone Encounter (Signed)
Reginald House , please, inform the patient:  elev chol  Please, mail the labs to the patient.   keep  next office visit appointment.   Thank you !

## 2010-08-24 ENCOUNTER — Ambulatory Visit (HOSPITAL_COMMUNITY)
Admission: RE | Admit: 2010-08-24 | Discharge: 2010-08-24 | Disposition: A | Discharge status: Home / Self Care | Payer: BLUE CROSS/BLUE SHIELD | Source: Ambulatory Visit | Attending: Gastroenterology | Admitting: Gastroenterology

## 2010-08-24 DIAGNOSIS — K225 Diverticulum of esophagus, acquired: Secondary | ICD-10-CM | POA: Insufficient documentation

## 2010-08-24 DIAGNOSIS — K449 Diaphragmatic hernia without obstruction or gangrene: Secondary | ICD-10-CM | POA: Insufficient documentation

## 2010-08-24 DIAGNOSIS — R1310 Dysphagia, unspecified: Secondary | ICD-10-CM | POA: Insufficient documentation

## 2010-08-25 ENCOUNTER — Telehealth: Payer: Self-pay | Admitting: *Deleted

## 2010-08-25 NOTE — Telephone Encounter (Signed)
Notified pt his appt with Dr Suzanna Obey is Sep 06, 2010 at 0900am. He should arrive early, bring his meds and his co pay. His address is [address], [place] [address]. Pt verbalized understanding.

## 2010-08-25 NOTE — Telephone Encounter (Signed)
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 Pt stated understanding and I will call him back with the appt.

## 2010-08-25 NOTE — Telephone Encounter (Signed)
Pt informed/ copies mailed.  He wants to know what should be done now because his PSA is still above normal. Please advise

## 2010-08-25 NOTE — Telephone Encounter (Signed)
Message copied by Graciella Freer on Fri Aug 25, 2010 11:48 AM ------      Message from: PATTERSON, DAVID      Created: Fri Aug 25, 2010  8:39 AM       He has a Zenker's diverticulum which would explain his upper pharyngeal dysphagia. Please refer him to Dr. Suzanna Obey in ENT for consultation.

## 2010-08-26 NOTE — Telephone Encounter (Signed)
I would suggest Urol consult. Is it OK to schedule?

## 2010-08-28 NOTE — Telephone Encounter (Signed)
Left mess for patient to call back.  

## 2010-08-29 NOTE — Telephone Encounter (Signed)
OK Dr Annabell Howells (ref placed)

## 2010-08-29 NOTE — Telephone Encounter (Signed)
Ok to schedule Urol consult per patient
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
## 2010-09-19 NOTE — Op Note (Signed)
Reginald House, Reginald House                  ACCOUNT NO.:  [id number]   MEDICAL RECORD NO.:  [id number]          PATIENT TYPE:  INP   LOCATION:  2899                         FACILITY:  [facility]   PHYSICIAN:  Doralee Albino. Carola Frost, M.D. DATE OF BIRTH:  [birth year]   DATE OF PROCEDURE:  07/16/2008  DATE OF DISCHARGE:                               OPERATIVE REPORT   PREOPERATIVE DIAGNOSIS:  Right proximal tibia fracture, bicondylar  plateau and tibial tubercle.   POSTOPERATIVE DIAGNOSES:  Right proximal tibia fracture, bicondylar  plateau and tibial tubercle, grade 2 lateral collateral ligament injury.   PROCEDURES:  1. Open reduction and internal fixation of bicondylar right tibial      plateau fracture.  2. Open reduction and internal fixation of tibial tubercle.  3. Close treatment of lateral collateral ligament injury with brace.  4. Anterior compartment fasciotomy.   SURGEON:  Doralee Albino. Carola Frost, MD   ASSISTANT:  Reginald Latin, PA   ANESTHESIA:  General.   COMPLICATIONS:  None.   TOURNIQUET:  None.   ESTIMATED BLOOD LOSS:  Approximately 115 mL.   DRAINS:  One anterior compartment, right leg.   ADDITIONAL FINDINGS:  Stable ACL, PCL, and MCL.   DISPOSITION:  PACU.   CONDITION:  Stable.   BRIEF SUMMARY OF INDICATIONS TO PROCEDURE:  Reginald House is otherwise  healthy male who sustained a right proximal tibia injury while snow  skiing.  The patient underwent a period of soft tissue swelling  resolution prior to surgery including icing, compression, and elevation.  We discussed preoperative risks and benefits of surgical fixation  including possibility of symptomatic hardware, infection, nerve injury,  vessel injury, compartment syndrome, need for further surgery, DVT, PE,  heart attack, stroke, arthritis, instability, and multiple others.  After full discussion, he wished to proceed as is his wife who  accompanied him in the decision-making.   BRIEF SUMMARY OF PROCEDURE:  Reginald House was  administered preoperative  vancomycin and taken to the operating room where general anesthesia was  induced.  His right lower extremity was then prepped and draped in usual  sterile fashion.  A limited anterior incision was made first somewhat on  the lateral side to allow for adequate exposure of the tibial tubercle  fracture extension and to see perhaps a limited incision technique could  be performed through this area.  The fracture site was cleaned with  curette lavage.  A lamina spreader placed into the defect to facilitate  this and then while the assistant pulled maximal traction with the use  of a ball spike pusher on the tubercle fragment and then a tenaculum  clamp placed carefully, we were able to reduce the tibial tubercle  fragment anatomically.  This was secured with 2 lag screws, one of which  later stripped and was removed along the tubercle countersinking them  beneath the level of the bone proximally, and additional lag screw was  placed at the level of subchondral bone from anterior to posterior in  the medial compartment.  The incision was then extended into the more  classic curvilinear anterolateral  approach taking down the anterior  corner of the anterior compartment and placing the plate over the  lateral edge and periosteal attachment of the proximal tibia.  In this  manner, we were able to preserve the blood supply and the underlying  structures.  After securing the plate in a appropriate position, the  [facility] clamp was then placed at the level of subchondral bone and  used while pulling longitudinal traction again to compress and restore  appropriate contour and height to the plateau medially and laterally.  After K-wire fixation again with help the assistant, AP and lateral  fluoro images showed appropriate reduction and alignment.      Doralee Albino. Carola Frost, M.D.  Electronically Signed     MHH/MEDQ  D:  07/16/2008  T:  07/16/2008  Job:  [id number]

## 2010-09-19 NOTE — Assessment & Plan Note (Signed)
[facility]                         GASTROENTEROLOGY OFFICE NOTE   Gerrit, Rafalski SPENCER CARDINAL                      MRN:          [id number]  DATE:10/04/2006                            DOB:          1961-02-08    This very nice patient of mine has known GERD that only responds to  Nexium. He has tried all of the other medications without any success  and he also has significant hyperlipidemia, but is otherwise has been in  good health. He had a colonoscopic examination in 2007 which was  essentially normal. I really do not think that he needs follow up  examinations any time in the near future because the one prior to that  was also basically normal except for some patchy erythematous mucosa  that was scattered and the biopsies were negative in both cases. At any  case, he does not respond well to other medicines and he says that he  knows well and the Nexium has been forgotten about as he has recurrent  symptoms quite regularly.   PHYSICAL EXAMINATION:  VITAL SIGNS:  Weight 188, blood pressure 100/70,  pulse 64 and regular.  NECK:  Unremarkable.  HEART:  Unremarkable.  EXTREMITIES:  Unremarkable.   IMPRESSION:  1. GERD, controlled with Nexium.  2. Hyperlipidemia.   RECOMMENDATIONS:  Continue on the Nexium and be followed up sometime in  the near future as needed. I advised him to get follow up care from one  of our physicians in the future on a relatively routine basis and also  to get an annual physical from Dr. Posey Rea with lab tests. He  understands this and will comply with this as his age increases. I gave  him some Nexium samples and I told him that I would write [facility]  [facility] because I certainly did not feel that he should have to pay  for these medications under the circumstances, since we have tried all  of the others and he did not respond to them nearly to the degree as  from the Nexium.     Ulyess Mort, MD  Electronically Signed    SML/MedQ  DD: 10/04/2006  DT: 10/05/2006  Job #: [id number]

## 2010-09-19 NOTE — Letter (Signed)
Oct 04, 2006    [facility]  [address]  [place], [place] [address]   RE:  EARLE, TROIANO  MRN:  [id number]  /  DOB:  [birth year]   To whom it may concern:   Mr. Ancil Dewan, who is a client of yours, insurance ID number [id number],  Group [id number], [address] [place], [place] [address], has significant  GERD, to the point where he only seems to get satisfactory symptomatic  relief from Nexium. He indicates that you will not help him pay for this  medication, which I think is certainly unreasonable because of the fact  that he has tried all of the other proton pump inhibitors without any  success. I would appreciate if you would consider reversing your policy  on this in his situation. If there is any other consideration possible  that will help him, I would certainly appreciate it.    Sincerely,      Ulyess Mort, MD  Electronically Signed    SML/MedQ  DD: 10/04/2006  DT: 10/05/2006  Job #: [id number]

## 2010-09-19 NOTE — Op Note (Signed)
NAMELEALON, VANPUTTEN                  ACCOUNT NO.:  [id number]   MEDICAL RECORD NO.:  [id number]          PATIENT TYPE:  INP   LOCATION:  5036                         FACILITY:  [facility]   PHYSICIAN:  Doralee Albino. Handy, M.D. DATE OF BIRTH:  [birth year]   DATE OF PROCEDURE:  07/16/2008  DATE OF DISCHARGE:                               OPERATIVE REPORT   ADDENDUM   The proximal tibial plateau plate used was a 3.5 by [employer] placing  rafter screws all the way across anteriorly with 2 screws and then  penetrating as far as the lag screw in the medial compartment with 2  more posterior screws.  Two kickstand screws were also placed into the  medial side to further support this and a lock screw was placed  distally.  This provided excellent fixation and stability.  Following  this, I then released the anterior compartment fascia to allow for  sufficient swelling postoperatively and to reduce the chance of  postoperative compartment syndrome.  Furthermore, deep drain was placed  into this compartment.  Lastly, the knee was examined for stability and  found to be stable, completely stable in full extension to varus and  valgus force with good anterior and posterior stability at 30 degrees of  flexion and 90 degrees of flexion.  There was some opening to varus  stress at 30 degrees of flexion consistent with a grade 2 injury of the  LCL with pseudolaxity of the MCL.  Final AP and lateral fluoro images  again confirmed appropriate alignment, congruity, hardware placement,  and length.  After thorough irrigation and standard layered closure with  0 Vicryl, 2-0 Vicryl, and staples, sterile gentle compressive dressing  was applied and the knee immobilizer.  The patient was awakened from  anesthesia and transported to PACU in stable condition.  Montez Morita, PA-  C, assisted throughout the procedure and was necessary for the safe and  effective completion of the case as he provided retraction of the  neurovascular structures, the force to reduce the fracture, assistance  with placement and removal of provisional fixation, and also maintenance  of reduction during definitive fixation as well as assistance in  simultaneous wound closure.   PROGNOSIS:  Mr. Lamour will be nonweightbearing with unrestricted range of  motion in the knees starting on postop day #2 and will be transitioned  into a hinged knee brace.  He will continue with Lovenox in the hospital  and for 10 days after discharge.  We anticipate discontinuation of the  hinged knee brace at 6 weeks with resumption of graduated weightbearing  at that time as well.  Given the need to place the anterior buttress  plate over his tibial tubercle, he does have increased chance for  symptomatic hardware.       Doralee Albino. Carola Frost, M.D.  Electronically Signed     MHH/MEDQ  D:  07/16/2008  T:  07/16/2008  Job:  [id number]

## 2010-09-22 NOTE — Discharge Summary (Signed)
NAMESHELDON, SEM                  ACCOUNT NO.:  [id number]   MEDICAL RECORD NO.:  [id number]          PATIENT TYPE:  INP   LOCATION:  5036                         FACILITY:  [facility]   PHYSICIAN:  Doralee Albino. Carola Frost, M.D. DATE OF BIRTH:  1961/04/02   DATE OF ADMISSION:  07/16/2008  DATE OF DISCHARGE:  07/18/2008                               DISCHARGE SUMMARY   DISCHARGE DIAGNOSES:  1. Right proximal tibial plateau fracture/bicondylar tibial plateau      and tibial tubercle fractures.  2. Lateral collateral ligament sprain.   ADDITIONAL DISCHARGE DIAGNOSES:  1. Hypercholesterolemia.  2. Gastroesophageal reflux disease.   PROCEDURES PERFORMED:  1. On July 16, 2008, ORIF, bicondylar tibial plateau fracture, right      side.  2. Open reduction and internal fixation, tibial tubercle fracture.  3. Closed treatment lateral collateral ligament injury.  4. Anterior compartment fasciotomy.   BRIEF HISTORY AND HOSPITAL COURSE:  Mr. Alano is a very pleasant 50-year-  old male who sustained a tibial plateau fracture while skiing.  He did  have extensive soft tissue injury, which precluded immediate operative  intervention after waiting approximately 2 weeks.  His soft tissue was  well healed and stable to permit safe surgical intervention.  The  patient underwent the procedure described above on July 16, 2008, and  tolerated the procedure very well without any acute complications.  After a brief stay in PACU for recovery from anesthesia, the patient was  transported to the pediatric floor for continued observation and pain  control.  The patient's hospital stay was relatively uneventful in 2  days at length.  Postoperative day #1, the patient was doing well.  His  pain was well controlled without any acute issues.  Postoperative day  #2, the patient continued to do well and was eager to go home.  His  drain was discontinued.  His wound look excellent and a Bledsoe hinged  knee brace was in  place to allow range of motion of his right knee.  After working with therapy on postoperative day #2, the patient was  discharged home with a followup and discharge instructions.   LABORATORY VALUES ON DISCHARGE:  Hemoglobin 10.9, hematocrit 30.8, white  blood cells 6.2, platelets 295.  Sodium 138, potassium 3.4, chloride 99,  bicarb 28, BUN 14, creatinine 1.11, and glucose 130.   Clinical encounter note,  Subjective/Objective:  The patient is doing great and wants to go home.  VITAL SIGNS:  Temperature 97.8, heart rate 87, respirations 20, BP  110/74.  GENERAL:  Wound looks outstanding.  Staples are clean, dry, and intact.  Incisions are clean, dry, and intact.  No drain has been put.  No deep  calf tenderness.  Deep peroneal nerve, superficial peroneal nerve, and  tibial nerve sensory functions are intact.  EHL, FHL, anterior tibialis,  posterior tibialis, peroneals, gastroc-soleus complex, and motor  function were also intact.  The compartments are soft, nontender as  well.   ASSESSMENT:  A 50 year old male status post open reduction and internal  fixation, right tibial plateau fracture.   PLAN:  The patient will be discharged home today with home health PT  arranged.  The patient will follow up with Dr. Carola Frost in 10-14 days as  well.  The patient is encouraged to continue with knee range of motion  activities and will continue be on weightbearing on his right lower  extremity.   DISCHARGE MEDICATIONS:  1. Percocet 10/325 one to two p.o. q.4-6 h. as needed for pain.  2. Robaxin 500 mg 1-2 p.o. q.6 h. as needed for spasms.  3. Lovenox 40 mg 1 subcutaneous injection daily for 10 days.  Of note,      the patient was started on Lovenox for DVT prophylaxis on      postoperative day #1 as well.   The patient  may resume his home medications, which include Nexium, baby  aspirin, and Citrucel.   DISCHARGE INSTRUCTIONS AND PLANS:  Mr. Karapetian did sustain a significant  injury to his  right lower extremity, but we were able to achieve  excellent fixation of his tibial plateau fracture.  Mr. Welton will  continue to be nonweightbearing on his right lower extremity for the  next 6-8 weeks.  However, he will be allowed unrestricted range of  motion of his knee with the hinged knee brace in place.  He will  continue to work with physical therapy and continue with home exercise  program.  We will continue with Lovenox for DVT prophylaxis for 10 days  after surgery, so we do anticipate that he will be mobile and will not  require any additional DVT prevention.  We will see Mr. Kincy back in 10-  14 days for reevaluation at which time we would obtain x-rays of his  operative site and remove his staples if his wounds are stable.  Again  at 6 weeks, we will initiate some graduated weightbearing, would  continue to use of the hinged knee brace for approximately 2 weeks after  initiation of weightbearing activities to provide additional support and  maintain forces in an axial direction.  Mr. Hasting should perform daily  dressing changes to his operative site and should clean the wound with  soap and water only.  No ointments or lotions should be applied as these  may prove to be more detrimental than good.  Should the patient have any  questions, he is to contact our office immediately, and we will address  the issues at hand.      Mearl Latin, PA      Doralee Albino. Carola Frost, M.D.  Electronically Signed    KWP/MEDQ  D:  09/03/2008  T:  09/04/2008  Job:  [id number]

## 2010-09-22 NOTE — H&P (Signed)
NAME:  Reginald House, Reginald House                            ACCOUNT NO.:  [id number]   MEDICAL RECORD NO.:  [id number]                   PATIENT TYPE:  EMS   LOCATION:  MAJO                                 FACILITY:  [facility]   PHYSICIAN:  Gordy Savers, M.D. [facility]      DATE OF BIRTH:  10/31/1960   DATE OF ADMISSION:  12/20/2002  DATE OF DISCHARGE:                                HISTORY & PHYSICAL   CHIEF COMPLAINT:  Muscle pain and weakness.   HISTORY OF PRESENT ILLNESS:  The patient is a 50 year old white gentleman  with a history of dyslipidemia.  He has been on Zocor 40 mg for  approximately four years.  He was stable until four days ago when he noted  the onset of progressive generalized muscle pain and weakness.  Due to his  worsening symptoms, he was evaluated in the emergency department where CPK  was in excess of 3700.  The patient is now admitted for further evaluation  and treatment of suspected statin myopathy.   PAST MEDICAL HISTORY:  The patient has a history of hypercholesterolemia, as  well as gastrointestinal reflux disease.  He has been evaluated by Ulyess Mort, M.D., in the past and has had both upper and lower endoscopy.  He  has had a remote tonsillectomy as a child.  No other medical illnesses.  He  has had no other prior hospital admissions.   MEDICATIONS:  His medical regimen prior to admission includes:  1. Zocor 40 mg daily.  2. Nexium 40 mg daily.  3. Baby aspirin two daily.  4. Citrucel daily.   He had been taken some ibuprofen after the onset of his acute illness.   SOCIAL HISTORY:  The patient is married with two children, a daughter age 78  and a son age 84.  He is a social drinker and a nonsmoker.   FAMILY HISTORY:  His father died approximately one year ago at age 39 of  complications of diabetes.  The family history is strongly positive for  diabetes.  His mother, age 85, is in good health.  One brother has  hypertension.   REVIEW OF SYSTEMS:   Otherwise unremarkable.   PHYSICAL EXAMINATION:  GENERAL APPEARANCE:  A well-developed, mildly  overweight male in no acute distress.  SKIN:  Unremarkable without rash.  HEENT:  Normal.  NECK:  No bruits or adenopathy.  CHEST:  Clear.  CARDIOVASCULAR:  Normal S1 and S2.  No murmurs or gallops.  ABDOMEN:  Benign.  There was no hepatic enlargement or tenderness.  EXTREMITIES:  Negative.  MUSCULOSKELETAL:  Rather generalized muscle tenderness to palpation.    IMPRESSION:  1. 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  2. Dyslipidemia.  3. Gastrointestinal reflux disease.   DISPOSITION:  The patient will be admitted to the hospital and carefully  hydrated.  Serial muscle enzymes and electrolytes will be monitored.  The  statin therapy will be discontinued.  He  will be considered for alternate  lipid therapy in the future.                                                Gordy Savers, M.D. [facility]    PFK/MEDQ  D:  12/20/2002  T:  12/20/2002  Job:  [id number]

## 2010-09-22 NOTE — Op Note (Signed)
NAMEHAGAN, VANAUKEN                  ACCOUNT NO.:  [id number]   MEDICAL RECORD NO.:  [id number]          PATIENT TYPE:  INP   LOCATION:  0098                         FACILITY:  [facility]   PHYSICIAN:  Artist Pais. Weingold, M.D.DATE OF BIRTH:  1961-01-07   DATE OF PROCEDURE:  07/03/2006  DATE OF DISCHARGE:                               OPERATIVE REPORT   PREOPERATIVE DIAGNOSIS:  Displaced intra-articular left distal radius  fracture with carpal tunnel syndrome.   POSTOPERATIVE DIAGNOSIS:  Displaced intra-articular left distal radius  fracture with carpal tunnel syndrome.   PROCEDURE:  Open reduction/internal fixation above using DVR left  standard plate and screws and left carpal tunnel release through a  separate incision.   SURGEON:  Artist Pais. Mina Marble, M.D.   ASSISTANT:  None.   ANESTHESIA:  General.   TOURNIQUET TIME:  An hour and 8 minutes.   COMPLICATIONS:  None.   DRAINS:  None.   OPERATIVE REPORT:  Patient was taken to the operating suite after the  induction of adequate general anesthesia.  The left upper extremity was  prepped and draped in a sterile fashion.  An esmarch was used to  exsanguinate the limb.  The tourniquet was then inflated to 250 mmHg.  At this point in time, a longitudinal incision was made over the  palpable border of the flexor carpal radialis tendon and the distal  aspect of the forearm and wrist, left side.  The skin was incised for a  distance of 7 cm.  The sheath overlying the flexor carpal radialis was  incised.  The flexor carpal radialis was retracted in the midline, the  radial artery to the lateral side.  This interval was developed at the  level of the pronator quadratus.  There was significant disruption of  the pronator quadratus from this significantly volarly displaced  fracture/dislocation of the wrist.  The pronator quadratus was  subperiosteally stripped off the fracture fragments and then with a roll  towel underneath the wrist,  the hand was extended, aiding reduction.  Once reduction was obtained, intraoperative fluoroscopy revealed  adequate reduction with the AP and lateral oblique view.  The DVR plate  was then fastened to the volar aspect of the radius, capturing the intra-  articular fragments.  It was fixed using a cortical screw through the  distal lateral hole.  Intraoperative fluoroscopy under direct vision  revealed adequate reduction.  The remaining cortical screws were placed  proximally, and the seven sleeve pegs distally.  At this point in time,  the wound was thoroughly irrigated.  A small incision was made in the  palmar aspect of the left hand.  The palmar fascia was identified and  split.  The distal edge of the transverse carpal ligament was  identified.  It was split.  The median nerve was identified and  protected with a Therapist, nutritional.  The remaining aspect of the transverse  carpal ligament was divided under direct vision.  The median nerve was  decompressed.  There was blood in the canal, but this was irrigated out.  Both wounds were then  irrigated and loosely closed in layers of 0  Vicryl for the pronator quadratus and 3-0 Prolene for both skin  incisions.  Steri-Strips, 4x4 Fluffs, a compressive dressing and volar  splint was applied.  The patient tolerated the procedure well and went  to the recovery room in stable fashion.      Artist Pais Mina Marble, M.D.  Electronically Signed     MAW/MEDQ  D:  07/03/2006  T:  07/03/2006  Job:  [id number]
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
## 2010-09-22 NOTE — Discharge Summary (Signed)
   Reginald House, Reginald House                            ACCOUNT NO.:  [id number]   MEDICAL RECORD NO.:  [id number]                   PATIENT TYPE:  INP   LOCATION:  5739                                 FACILITY:  [facility]   PHYSICIAN:  Rene Paci, M.D. [facility]          DATE OF BIRTH:  09-Sep-1960   DATE OF ADMISSION:  12/20/2002  DATE OF DISCHARGE:  12/22/2002                                 DISCHARGE SUMMARY   DISCHARGE DIAGNOSES:  1. Myositis.  2. Glucose intolerance.   BRIEF ADMISSION HISTORY:  Reginald House is a 50 year old white male with a  history of dyslipidemia on Zocor for a number of years. He presented to the  emergency room with a four-day history of worsening weakness and myalgias.  In the emergency room, his CPK was found to be 3,750. 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   PAST MEDICAL HISTORY:  1. Hypercholesterolemia.  2. Gastroesophageal reflux disease.   HOSPITAL COURSE:  1. Statin-induced myositis. The patient's CKs rose to 5,237 before starting     to trend down. The patient's renal function remained stable. The     patient's muscle weakness is improving. The patient was felt to be stable     for discharge home. He was encouraged to stay well hydrated, drinking at     least two to three liters of fluids daily. We also scheduled the patient     to followup with Dr. Posey Rea.  2. Glucose intolerance. The patient's fasting blood sugars have been mildly     elevated from 127 to 137. We will notify his primary care physician that     he may have some mild glucose intolerance that needs to be followed. He     does have a family history of diabetes.  3. Hypercholesterolemia. We did obtain a fasting lipid profile, results of     which are still pending. The patient's Zocor was discontinued secondary     to #1. Suspect the patient will have difficulty with all statins. Again,     we will defer to his primary care physician if further cholesterol     lowering  medication is indicated.   LABORATORY DATA:  CK was 2,639, potassium 3.4, glucose 137. Fasting lipid  profile pending.   MEDICATIONS AT DISCHARGE:  1. Nexium 40 mg q.d.  2. Aspirin 81 mg q.d.   FOLLOW UP:  The patient is instructed to follow up with Dr. Posey Rea on  Monday, August 23, at 11:15 a.m.     Cornell Barman, P.A. [facility]                  Rene Paci, M.D. [facility]   LC/MEDQ  D:  12/22/2002  T:  12/23/2002  Job:  [id number]   cc:   Reginald House, M.D. [facility]

## 2010-10-17 ENCOUNTER — Telehealth: Payer: Self-pay | Admitting: Gastroenterology

## 2010-10-17 NOTE — Telephone Encounter (Signed)
Pt called to advise [place] that he has found his own GI md at [facility] and he wanted to know how to get his records, We have advised him that this means transfer of care. He states understanding and will come to get his records.

## 2011-02-19 LAB — WOUND CULTURE

## 2011-03-29 ENCOUNTER — Encounter (HOSPITAL_COMMUNITY): Payer: Self-pay | Admitting: Emergency Medicine

## 2011-03-29 ENCOUNTER — Emergency Department (HOSPITAL_COMMUNITY)
Admission: EM | Admit: 2011-03-29 | Discharge: 2011-03-29 | Disposition: A | Discharge status: Home / Self Care | Payer: BC Managed Care – PPO | Attending: Emergency Medicine | Admitting: Emergency Medicine

## 2011-03-29 DIAGNOSIS — Z79899 Other long term (current) drug therapy: Secondary | ICD-10-CM | POA: Insufficient documentation

## 2011-03-29 DIAGNOSIS — R209 Unspecified disturbances of skin sensation: Secondary | ICD-10-CM | POA: Insufficient documentation

## 2011-03-29 DIAGNOSIS — E785 Hyperlipidemia, unspecified: Secondary | ICD-10-CM | POA: Insufficient documentation

## 2011-03-29 DIAGNOSIS — M79609 Pain in unspecified limb: Secondary | ICD-10-CM | POA: Insufficient documentation

## 2011-03-29 DIAGNOSIS — M5380 Other specified dorsopathies, site unspecified: Secondary | ICD-10-CM | POA: Insufficient documentation

## 2011-03-29 DIAGNOSIS — K219 Gastro-esophageal reflux disease without esophagitis: Secondary | ICD-10-CM | POA: Insufficient documentation

## 2011-03-29 DIAGNOSIS — M549 Dorsalgia, unspecified: Secondary | ICD-10-CM

## 2011-03-29 DIAGNOSIS — M546 Pain in thoracic spine: Secondary | ICD-10-CM | POA: Insufficient documentation

## 2011-03-29 DIAGNOSIS — Z7982 Long term (current) use of aspirin: Secondary | ICD-10-CM | POA: Insufficient documentation

## 2011-03-29 MED ORDER — DIAZEPAM 5 MG PO TABS: ORAL_TABLET | ORAL | Status: AC

## 2011-03-29 MED ORDER — DIAZEPAM 5 MG PO TABS
10.0000 mg | ORAL_TABLET | Freq: Once | ORAL | Status: AC
  Administered 2011-03-29: 10 mg via ORAL
  Filled 2011-03-29: qty 2

## 2011-03-29 MED ORDER — PREDNISONE 50 MG PO TABS: ORAL_TABLET | ORAL | Status: AC

## 2011-03-29 MED ORDER — PREDNISONE 20 MG PO TABS
60.0000 mg | ORAL_TABLET | Freq: Once | ORAL | Status: AC
  Administered 2011-03-29: 60 mg via ORAL
  Filled 2011-03-29: qty 3

## 2011-03-29 MED ORDER — HYDROCODONE-ACETAMINOPHEN 5-500 MG PO TABS: 1.0000 | ORAL_TABLET | Freq: Four times a day (QID) | ORAL | Status: DC | PRN

## 2011-03-29 NOTE — ED Provider Notes (Signed)
Medical screening examination/treatment/procedure(s) were performed by non-physician practitioner and as supervising physician I was immediately available for consultation/collaboration.  Jerid Catherman, MD 03/29/11 1315 

## 2011-03-29 NOTE — ED Notes (Signed)
Family at bedside. 

## 2011-03-29 NOTE — ED Provider Notes (Signed)
History     CSN: [id number] Arrival date & time: 03/29/2011  7:28 AM   First MD Initiated Contact with Patient 03/29/11 [phone]      Chief Complaint  Patient presents with  . Back Pain    (Consider location/radiation/quality/duration/timing/severity/associated sxs/prior treatment) HPI  Patient presents to emergency department complaining of a one week history of gradual onset mid upper back pain he states started after sleeping on a very uncomfortable mattress while traveling abroad that he states is similar to back pain he had a proximally 5 years ago when he also slept on a very uncomfortable mattress. Patient states since returning home a week ago increasing mid upper back pain with pain aggravated by range of motion in bending forward. Denies alleviating factors. Patient went to see a chiropractor but states despite seeing him and taking at home ibuprofen on multiple doses no improvement in pain. Patient states pain radiates from mid back into right arm with tingling in left fingers. Denies extremity weakness, numbness, or loss of range of motion. Patient denies chest pain, shortness of breath, abdominal pain, nausea, vomiting, diarrhea. Patient states he has an appointment to see Delbert Harness, orthopedics on Monday, approximately 4 days from now but says unable to sleep at night due to pain. Symptoms were gradual onset, persistent, and worsening.  Past Medical History  Diagnosis Date  . Other symptoms involving head and neck   . Contact dermatitis and other eczema, due to unspecified cause   . Other chronic nonalcoholic liver disease   . Unspecified adverse effect of unspecified drug, medicinal and biological substance   . Nonspecific elevation of levels of transaminase or lactic acid dehydrogenase (LDH)   . Esophageal reflux   . Hiatal hernia   . External hemorrhoids without mention of complication   . Other and unspecified hyperlipidemia     No past surgical history on  file.  Family History  Problem Relation Age of Onset  . Colon cancer Neg Hx     History  Substance Use Topics  . Smoking status: Never Smoker   . Smokeless tobacco: Never Used  . Alcohol Use: Yes     social       Review of Systems  All other systems reviewed and are negative.    Allergies  Colesevelam; Ezetimibe; Niacin; Omeprazole; Penicillins; and Simvastatin  Home Medications   Current Outpatient Rx  Name Route Sig Dispense Refill  . ASPIRIN 325 MG PO TABS Oral Take 325 mg by mouth daily.      Marland Kitchen ESOMEPRAZOLE MAGNESIUM 40 MG PO CPDR Oral Take 40 mg by mouth 2 (two) times daily.      . METHYLCELLULOSE (LAXATIVE) 500 MG PO TABS Oral Take 1 tablet by mouth.      Marland Kitchen DIAZEPAM 5 MG PO TABS  Take 1 tablet by mouth every 4-6 hours as needed for muscle relaxation 15 tablet 0  . HYDROCODONE-ACETAMINOPHEN 5-500 MG PO TABS Oral Take 1-2 tablets by mouth every 6 (six) hours as needed for pain. 15 tablet 0  . PREDNISONE 50 MG PO TABS  Take 1 tablet by mouth daily for total of 5 days. 5 tablet 0    BP 124/82  Pulse 72  Temp(Src) 97.9 F (36.6 C) (Oral)  Resp 16  SpO2 99%  Physical Exam  Nursing note and vitals reviewed. Constitutional: He is oriented to person, place, and time. He appears well-developed and well-nourished. No distress.  HENT:  Head: Normocephalic and atraumatic.  Eyes: Conjunctivae are normal.  Neck: Normal range of motion. Neck supple.  Cardiovascular: Normal rate, regular rhythm, normal heart sounds and intact distal pulses.  Exam reveals no gallop and no friction rub.   No murmur heard. Pulmonary/Chest: Effort normal and breath sounds normal. No respiratory distress. He has no wheezes. He has no rales. He exhibits no tenderness.  Abdominal: Bowel sounds are normal. He exhibits no distension and no mass. There is no tenderness. There is no rebound and no guarding.  Musculoskeletal: Normal range of motion. He exhibits no edema and no tenderness.        Thoracic back: He exhibits tenderness and spasm.  Neurological: He is alert and oriented to person, place, and time. He has normal strength and normal reflexes. He displays normal reflexes. No sensory deficit. He exhibits normal muscle tone.  Skin: Skin is warm and dry. No rash noted. He is not diaphoretic. No erythema.  Psychiatric: He has a normal mood and affect.    ED Course  Procedures (including critical care time)  Labs Reviewed - No data to display No results found.   1. Back pain       MDM  No signs or symptoms of cauda equina or central cord compression. Full range of motion of bilateral upper extremities with 5 out of 5 strength against resistance. Full sensation to light and dull touch. No known injury but the patient states pain is similar to pain 5 years ago when he slept on an uncomfortable mattress. Patient has followup with orthopedic specialist on Monday, 4 days.        Lenon Oms [place], [place] 03/29/11 [phone]

## 2011-03-30 ENCOUNTER — Encounter (HOSPITAL_COMMUNITY): Payer: Self-pay

## 2011-03-30 ENCOUNTER — Emergency Department (HOSPITAL_COMMUNITY): Payer: BC Managed Care – PPO

## 2011-03-30 ENCOUNTER — Other Ambulatory Visit: Payer: Self-pay

## 2011-03-30 ENCOUNTER — Emergency Department (HOSPITAL_COMMUNITY)
Admission: EM | Admit: 2011-03-30 | Discharge: 2011-03-30 | Disposition: A | Discharge status: Home / Self Care | Payer: BC Managed Care – PPO | Attending: Emergency Medicine | Admitting: Emergency Medicine

## 2011-03-30 DIAGNOSIS — Z79899 Other long term (current) drug therapy: Secondary | ICD-10-CM | POA: Insufficient documentation

## 2011-03-30 DIAGNOSIS — Z9889 Other specified postprocedural states: Secondary | ICD-10-CM | POA: Insufficient documentation

## 2011-03-30 DIAGNOSIS — M542 Cervicalgia: Secondary | ICD-10-CM | POA: Insufficient documentation

## 2011-03-30 DIAGNOSIS — M5412 Radiculopathy, cervical region: Secondary | ICD-10-CM | POA: Insufficient documentation

## 2011-03-30 DIAGNOSIS — M25519 Pain in unspecified shoulder: Secondary | ICD-10-CM | POA: Insufficient documentation

## 2011-03-30 DIAGNOSIS — R209 Unspecified disturbances of skin sensation: Secondary | ICD-10-CM | POA: Insufficient documentation

## 2011-03-30 DIAGNOSIS — Z7982 Long term (current) use of aspirin: Secondary | ICD-10-CM | POA: Insufficient documentation

## 2011-03-30 DIAGNOSIS — M79609 Pain in unspecified limb: Secondary | ICD-10-CM | POA: Insufficient documentation

## 2011-03-30 DIAGNOSIS — K219 Gastro-esophageal reflux disease without esophagitis: Secondary | ICD-10-CM | POA: Insufficient documentation

## 2011-03-30 DIAGNOSIS — K769 Liver disease, unspecified: Secondary | ICD-10-CM | POA: Insufficient documentation

## 2011-03-30 MED ORDER — HYDROMORPHONE HCL PF 1 MG/ML IJ SOLN
1.0000 mg | Freq: Once | INTRAMUSCULAR | Status: AC
  Administered 2011-03-30: 1 mg via INTRAVENOUS
  Filled 2011-03-30: qty 1

## 2011-03-30 MED ORDER — OXYCODONE-ACETAMINOPHEN 5-325 MG PO TABS: 1.0000 | ORAL_TABLET | ORAL | Status: AC | PRN

## 2011-03-30 MED ORDER — KETOROLAC TROMETHAMINE 30 MG/ML IJ SOLN
30.0000 mg | Freq: Once | INTRAMUSCULAR | Status: AC
  Administered 2011-03-30: 30 mg via INTRAVENOUS
  Filled 2011-03-30: qty 1

## 2011-03-30 MED ORDER — IBUPROFEN 600 MG PO TABS: 600.0000 mg | ORAL_TABLET | Freq: Three times a day (TID) | ORAL | Status: AC | PRN

## 2011-03-30 MED ORDER — SODIUM CHLORIDE 0.9 % IV SOLN
Freq: Once | INTRAVENOUS | Status: AC
  Administered 2011-03-30: 11:00:00 via INTRAVENOUS

## 2011-03-30 NOTE — ED Notes (Signed)
Pt states that he was seen here yesterday for the same complaint and is having increasing severe left arm, shoulder, and back pain. Pt states that he was given vicodin and prednisone with no relief. Pt states that he is unable to stand and is needing a stretcher because he cannot stand to stand up due to shoulder arm and back pain. Alert and oriented. Pt appears extremely anxious.

## 2011-03-30 NOTE — ED Notes (Signed)
Patient wanted more ginger-ale an crackers.  So i provided those.

## 2011-03-30 NOTE — ED Provider Notes (Signed)
History     CSN: [id number] Arrival date & time: 03/30/2011  9:08 AM   First MD Initiated Contact with Patient 03/30/11 1019      Chief Complaint  Patient presents with  . Extremity Pain    (Consider location/radiation/quality/duration/timing/severity/associated sxs/prior treatment) Patient is a 50 y.o. male presenting with extremity pain. The history is provided by the patient and the spouse.  Extremity Pain   patient reports severe left neck and arm pain.  He reports his been constant for 3 days.  He reports numbness and tingling in his left little and ring finger.  His had no weakness of his left arm.  He denies recent trauma.  He does report awakening from sleep after a 9/10 with pain in his left scapular and left neck region.  He has had a history of radicular symptoms before in 2004 and saw an outpatient orthopedist for this.  He's never had neck surgery.  He denies chest pain shortness of breath he denies nausea vomiting diarrhea.  Denies fever and chills.  His symptoms are severe.  They're constant.  No history of coronary artery disease  Past Medical History  Diagnosis Date  . Other symptoms involving head and neck   . Contact dermatitis and other eczema, due to unspecified cause   . Other chronic nonalcoholic liver disease   . Unspecified adverse effect of unspecified drug, medicinal and biological substance   . Nonspecific elevation of levels of transaminase or lactic acid dehydrogenase (LDH)   . Esophageal reflux   . Hiatal hernia   . External hemorrhoids without mention of complication   . Other and unspecified hyperlipidemia     Past Surgical History  Procedure Date  . Wrist surgery   . Leg surgery 2009    R leg repair    Family History  Problem Relation Age of Onset  . Colon cancer Neg Hx   . Diabetes Father     History  Substance Use Topics  . Smoking status: Never Smoker   . Smokeless tobacco: Never Used  . Alcohol Use: 0.6 oz/week    1 Glasses of  wine per week     social       Review of Systems  All other systems reviewed and are negative.    Allergies  Colesevelam; Ezetimibe; Niacin; Penicillins; and Simvastatin  Home Medications   Current Outpatient Rx  Name Route Sig Dispense Refill  . ASPIRIN 325 MG PO TABS Oral Take 325 mg by mouth daily.      Marland Kitchen DIAZEPAM 5 MG PO TABS  Take 1 tablet by mouth every 4-6 hours as needed for muscle relaxation 15 tablet 0  . ESOMEPRAZOLE MAGNESIUM 40 MG PO CPDR Oral Take 40 mg by mouth 2 (two) times daily.      Marland Kitchen HYDROCODONE-ACETAMINOPHEN 5-500 MG PO TABS Oral Take 1-2 tablets by mouth every 6 (six) hours as needed for pain. 15 tablet 0  . METHYLCELLULOSE (LAXATIVE) 500 MG PO TABS Oral Take 1 tablet by mouth.      Marland Kitchen PREDNISONE 50 MG PO TABS  Take 1 tablet by mouth daily for total of 5 days. 5 tablet 0    BP 127/75  Pulse 65  Temp(Src) 98 F (36.7 C) (Oral)  Resp 16  SpO2 95%  Physical Exam  Nursing note and vitals reviewed. Constitutional: He is oriented to person, place, and time. He appears well-developed and well-nourished.  HENT:  Head: Normocephalic and atraumatic.  Eyes: EOM are normal.  Neck: Normal range of motion. Neck supple.       No C-spine or paracervical tenderness  Cardiovascular: Normal rate, regular rhythm, normal heart sounds and intact distal pulses.   Pulmonary/Chest: Effort normal and breath sounds normal. No respiratory distress.  Abdominal: Soft. He exhibits no distension. There is no tenderness.  Musculoskeletal: Normal range of motion.  Neurological: He is alert and oriented to person, place, and time.       Bilateral upper extremities with normal strength in major muscle groups.  Normal grip bilaterally.  Paresthesias of his left little finger  Skin: Skin is warm and dry.  Psychiatric: He has a normal mood and affect. Judgment normal.    ED Course  Procedures (including critical care time)  Labs Reviewed - No data to display Mr Cervical Spine Wo  Contrast  03/30/2011  *RADIOLOGY REPORT*  Clinical Data: Radicular symptoms rating than on the left arm with severe pain and numbness.  Neck pain extending into the trapezius.  MRI CERVICAL SPINE WITHOUT CONTRAST  Technique:  Multiplanar and multiecho pulse sequences of the cervical spine, to include the craniocervical junction and cervicothoracic junction, were obtained according to standard protocol without intravenous contrast.  Comparison: None.  Findings: Reversal of the normal cervical lordosis is present.  The cervical cord demonstrates normal caliber and intramedullary signal.  Craniocervical alignment is normal.  Posterior fossa structures are within normal limits.  The marrow signal is normal. 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 This is in proximity to the esophagus.  If the patient has complaints of dysphagia, esophagram could be performed for further evaluation.  The structure is right eccentric and at the level of the thyroid gland.  C2-C3:  Negative.  C3-C4:  Mild bilateral facet degeneration.  No stenosis.  C4-C5:  Shallow broad-based disc osteophyte complex with mild central stenosis.  Foramina appear patent.  C5-C6:  Shallow disc osteophyte complex with mild central stenosis. Foramina appear patent.  C6-C7:  Shallow disc osteophyte complex.  Central canal and foramina appear patent.  C7-T1:  Central canal and right neural foramen appear adequately patent.  There is a small left foraminal protrusion potentially affecting the left C8 nerve.  This is best seen on sagittal image number 10.  IMPRESSION: 1.  Left C7-T1 foraminal disc protrusion potentially affecting the left C8 nerve. 2.  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  Original Report Authenticated By: Andreas Newport, M.D.   I personally reviewed the MRI  1. Cervical radicular pain        MDM  MRI obtained to evaluate for disc protrusion as cause of radicular symptoms.  It appears as though there may be a small left C7-T1 foraminal disc protrusion that could be affecting the left C8 nerve which could be consistent with the patient's symptoms.  Over the patient back to his orthopedic surgeon for further evaluation.  He was treated in the ER yesterday with Vicodin and Valium without relief.  His pain is resolved after 1 mg IV Dilaudid in the ER.  The patient will be switched over to Percocet for better pain control.  The patient was given a copy of his MRI report  12:56 PM Patient feels much better at this time.  Still with some pain will dose again with ketorolac and Dilaudid.  Home with followup with his orthopedic surgeon and referral to neurosurgery as needed.  I suspect his  orthopedic surgeon can refer him to someone within the orthopedic group for possible radicular symptoms and possible need for cervical surgery in the future.  Will DC Vicodin and change over to [facility]  Family given a copy of the MRI report       Lyanne Co, MD 03/30/11 1257

## 2011-03-30 NOTE — ED Notes (Signed)
Discharge instructions reviewed with pt/wife.  Verbalizes understanding.  No questions asked; no further c/o's voiced.  Pt ambulatory to lobby with wife.  NAD noted.

## 2011-04-02 ENCOUNTER — Other Ambulatory Visit: Payer: Self-pay | Admitting: Orthopedic Surgery

## 2011-04-02 ENCOUNTER — Ambulatory Visit
Admission: RE | Admit: 2011-04-02 | Discharge: 2011-04-02 | Disposition: A | Discharge status: Home / Self Care | Payer: BC Managed Care – PPO | Source: Ambulatory Visit | Attending: Orthopedic Surgery | Admitting: Orthopedic Surgery

## 2011-04-02 DIAGNOSIS — M5020 Other cervical disc displacement, unspecified cervical region: Secondary | ICD-10-CM

## 2011-04-02 MED ORDER — TRIAMCINOLONE ACETONIDE 40 MG/ML IJ SUSP (RADIOLOGY)
60.0000 mg | Freq: Once | INTRAMUSCULAR | Status: AC
  Administered 2011-04-02: 60 mg via EPIDURAL

## 2011-04-02 MED ORDER — IOHEXOL 300 MG/ML  SOLN
1.0000 mL | Freq: Once | INTRAMUSCULAR | Status: AC | PRN
  Administered 2011-04-02: 1 mL via EPIDURAL

## 2011-04-02 NOTE — Patient Instructions (Signed)

## 2011-04-25 ENCOUNTER — Other Ambulatory Visit: Payer: Self-pay | Admitting: Neurosurgery

## 2011-04-27 ENCOUNTER — Encounter (HOSPITAL_COMMUNITY): Payer: Self-pay

## 2011-05-11 ENCOUNTER — Encounter (HOSPITAL_COMMUNITY)
Admission: RE | Admit: 2011-05-11 | Discharge: 2011-05-11 | Disposition: A | Discharge status: Home / Self Care | Payer: BC Managed Care – PPO | Source: Ambulatory Visit | Attending: Neurosurgery | Admitting: Neurosurgery

## 2011-05-11 ENCOUNTER — Encounter (HOSPITAL_COMMUNITY): Payer: Self-pay

## 2011-05-11 LAB — ABO/RH: ABO/RH(D): B POS

## 2011-05-11 LAB — CBC
Hemoglobin: 15.5 g/dL (ref 13.0–17.0)
MCH: 30.6 pg (ref 26.0–34.0)
MCHC: 35.8 g/dL (ref 30.0–36.0)

## 2011-05-11 LAB — SURGICAL PCR SCREEN
MRSA, PCR: NEGATIVE
Staphylococcus aureus: NEGATIVE

## 2011-05-11 LAB — TYPE AND SCREEN
ABO/RH(D): B POS
Antibody Screen: NEGATIVE

## 2011-05-11 LAB — BASIC METABOLIC PANEL
Calcium: 10.4 mg/dL (ref 8.4–10.5)
GFR calc non Af Amer: 75 mL/min — ABNORMAL LOW (ref >90)
Glucose, Bld: 103 mg/dL — ABNORMAL HIGH (ref 70–99)
Sodium: 140 mEq/L (ref 135–145)

## 2011-05-11 NOTE — Pre-Procedure Instructions (Signed)
20 Reginald House  05/11/2011   Your procedure is scheduled on: Monday 05/14/11   Report to [facility] at 900 AM.  Call this number if you have problems the morning of surgery: [phone]   Remember:   Do not eat food:After Midnight.  May have clear liquids: up to 4 Hours before arrival.  Clear liquids include soda, tea, black coffee, apple or grape juice, broth.  Take these medicines the morning of surgery with A SIP OF WATER: NEXIUM, PERCOCET    Do not wear jewelry, make-up or nail polish.  Do not wear lotions, powders, or perfumes. You may wear deodorant.  Do not shave 48 hours prior to surgery.  Do not bring valuables to the hospital.  Contacts, dentures or bridgework may not be worn into surgery.  Leave suitcase in the car. After surgery it may be brought to your room.  For patients admitted to the hospital, checkout time is 11:00 AM the day of discharge.   Patients discharged the day of surgery will not be allowed to drive home.  Name and phone number of your driver:   Special Instructions: CHG Shower Use Special Wash: 1/2 bottle night before surgery and 1/2 bottle morning of surgery.   Please read over the following fact sheets that you were given: Pain Booklet, MRSA Information and Surgical Site Infection Prevention   STOP ASPIRIN, COUMADIN , PLAVIX, EFFIENT, HERBAL MEDICINES

## 2011-05-13 MED ORDER — VANCOMYCIN HCL IN DEXTROSE 1-5 GM/200ML-% IV SOLN
1000.0000 mg | INTRAVENOUS | Status: AC
  Administered 2011-05-14: 1000 mg via INTRAVENOUS
  Filled 2011-05-13: qty 200

## 2011-05-14 ENCOUNTER — Ambulatory Visit (HOSPITAL_COMMUNITY): Payer: BC Managed Care – PPO | Admitting: Anesthesiology

## 2011-05-14 ENCOUNTER — Encounter (HOSPITAL_COMMUNITY): Payer: Self-pay | Admitting: Surgery

## 2011-05-14 ENCOUNTER — Encounter (HOSPITAL_COMMUNITY): Payer: Self-pay | Admitting: Anesthesiology

## 2011-05-14 ENCOUNTER — Encounter (HOSPITAL_COMMUNITY)
Admission: RE | Disposition: A | Discharge status: Home / Self Care | Payer: Self-pay | Source: Ambulatory Visit | Attending: Neurosurgery

## 2011-05-14 ENCOUNTER — Ambulatory Visit (HOSPITAL_COMMUNITY): Payer: BC Managed Care – PPO

## 2011-05-14 ENCOUNTER — Ambulatory Visit (HOSPITAL_COMMUNITY)
Admission: RE | Admit: 2011-05-14 | Discharge: 2011-05-15 | Disposition: A | Discharge status: Home / Self Care | Payer: BC Managed Care – PPO | Source: Ambulatory Visit | Attending: Neurosurgery | Admitting: Neurosurgery

## 2011-05-14 DIAGNOSIS — Z01812 Encounter for preprocedural laboratory examination: Secondary | ICD-10-CM | POA: Insufficient documentation

## 2011-05-14 DIAGNOSIS — M5030 Other cervical disc degeneration, unspecified cervical region: Secondary | ICD-10-CM | POA: Insufficient documentation

## 2011-05-14 DIAGNOSIS — M5020 Other cervical disc displacement, unspecified cervical region: Secondary | ICD-10-CM | POA: Diagnosis present

## 2011-05-14 DIAGNOSIS — K449 Diaphragmatic hernia without obstruction or gangrene: Secondary | ICD-10-CM | POA: Insufficient documentation

## 2011-05-14 DIAGNOSIS — M47812 Spondylosis without myelopathy or radiculopathy, cervical region: Secondary | ICD-10-CM | POA: Insufficient documentation

## 2011-05-14 DIAGNOSIS — K219 Gastro-esophageal reflux disease without esophagitis: Secondary | ICD-10-CM | POA: Insufficient documentation

## 2011-05-14 HISTORY — DX: Diverticulitis of intestine, part unspecified, without perforation or abscess without bleeding: K57.92

## 2011-05-14 HISTORY — PX: ANTERIOR CERVICAL DECOMP/DISCECTOMY FUSION: SHX1161

## 2011-05-14 SURGERY — ANTERIOR CERVICAL DECOMPRESSION/DISCECTOMY FUSION 3 LEVELS
Anesthesia: General | Wound class: Clean

## 2011-05-14 MED ORDER — SODIUM CHLORIDE 0.9 % IJ SOLN
3.0000 mL | Freq: Two times a day (BID) | INTRAMUSCULAR | Status: DC
  Administered 2011-05-14: 3 mL via INTRAVENOUS

## 2011-05-14 MED ORDER — ROCURONIUM BROMIDE 100 MG/10ML IV SOLN
INTRAVENOUS | Status: DC | PRN
  Administered 2011-05-14: 50 mg via INTRAVENOUS

## 2011-05-14 MED ORDER — PHENOL 1.4 % MT LIQD: 1.0000 | OROMUCOSAL | Status: DC | PRN

## 2011-05-14 MED ORDER — MAGNESIUM HYDROXIDE 400 MG/5ML PO SUSP: 30.0000 mL | Freq: Every day | ORAL | Status: DC | PRN

## 2011-05-14 MED ORDER — DROPERIDOL 2.5 MG/ML IJ SOLN
INTRAMUSCULAR | Status: DC | PRN
  Administered 2011-05-14: 0.625 mg via INTRAVENOUS

## 2011-05-14 MED ORDER — ONDANSETRON HCL 4 MG/2ML IJ SOLN: 4.0000 mg | Freq: Four times a day (QID) | INTRAMUSCULAR | Status: DC | PRN

## 2011-05-14 MED ORDER — MENTHOL 3 MG MT LOZG
1.0000 | LOZENGE | OROMUCOSAL | Status: DC | PRN
  Filled 2011-05-14: qty 9

## 2011-05-14 MED ORDER — SODIUM CHLORIDE 0.9 % IR SOLN
Status: DC | PRN
  Administered 2011-05-14: 12:00:00

## 2011-05-14 MED ORDER — ZOLPIDEM TARTRATE 5 MG PO TABS: 10.0000 mg | ORAL_TABLET | Freq: Every evening | ORAL | Status: DC | PRN

## 2011-05-14 MED ORDER — ONDANSETRON HCL 4 MG/2ML IJ SOLN
INTRAMUSCULAR | Status: DC | PRN
  Administered 2011-05-14: 4 mg via INTRAVENOUS

## 2011-05-14 MED ORDER — CYCLOBENZAPRINE HCL 10 MG PO TABS
10.0000 mg | ORAL_TABLET | Freq: Three times a day (TID) | ORAL | Status: DC | PRN
  Administered 2011-05-14 – 2011-05-15 (×2): 10 mg via ORAL
  Filled 2011-05-14 (×2): qty 1

## 2011-05-14 MED ORDER — PANTOPRAZOLE SODIUM 40 MG PO TBEC: 40.0000 mg | DELAYED_RELEASE_TABLET | Freq: Every day | ORAL | Status: DC

## 2011-05-14 MED ORDER — ALUM & MAG HYDROXIDE-SIMETH 200-200-20 MG/5ML PO SUSP: 30.0000 mL | Freq: Four times a day (QID) | ORAL | Status: DC | PRN

## 2011-05-14 MED ORDER — LIDOCAINE-EPINEPHRINE 1 %-1:100000 IJ SOLN
INTRAMUSCULAR | Status: DC | PRN
  Administered 2011-05-14: 20 mL

## 2011-05-14 MED ORDER — BISACODYL 10 MG RE SUPP: 10.0000 mg | Freq: Every day | RECTAL | Status: DC | PRN

## 2011-05-14 MED ORDER — MORPHINE SULFATE 4 MG/ML IJ SOLN
4.0000 mg | INTRAMUSCULAR | Status: DC | PRN
  Administered 2011-05-14: 4 mg via INTRAMUSCULAR
  Filled 2011-05-14: qty 1

## 2011-05-14 MED ORDER — HYDROCODONE-ACETAMINOPHEN 5-325 MG PO TABS: 1.0000 | ORAL_TABLET | ORAL | Status: DC | PRN

## 2011-05-14 MED ORDER — KETOROLAC TROMETHAMINE 30 MG/ML IJ SOLN
30.0000 mg | Freq: Four times a day (QID) | INTRAMUSCULAR | Status: DC
  Administered 2011-05-14 – 2011-05-15 (×2): 30 mg via INTRAVENOUS
  Filled 2011-05-14 (×6): qty 1

## 2011-05-14 MED ORDER — EPHEDRINE SULFATE 50 MG/ML IJ SOLN
INTRAMUSCULAR | Status: DC | PRN
  Administered 2011-05-14: 10 mg via INTRAVENOUS

## 2011-05-14 MED ORDER — PROPOFOL 10 MG/ML IV EMUL
INTRAVENOUS | Status: DC | PRN
  Administered 2011-05-14: 170 mg via INTRAVENOUS

## 2011-05-14 MED ORDER — ACETAMINOPHEN 650 MG RE SUPP: 650.0000 mg | RECTAL | Status: DC | PRN

## 2011-05-14 MED ORDER — THROMBIN 20000 UNITS EX KIT
PACK | OROMUCOSAL | Status: DC | PRN
  Administered 2011-05-14: 12:00:00 via TOPICAL

## 2011-05-14 MED ORDER — BUPIVACAINE HCL (PF) 0.25 % IJ SOLN
INTRAMUSCULAR | Status: DC | PRN
  Administered 2011-05-14: 30 mL

## 2011-05-14 MED ORDER — PSYLLIUM 95 % PO PACK
1.0000 | PACK | Freq: Every day | ORAL | Status: DC
  Filled 2011-05-14 (×2): qty 1

## 2011-05-14 MED ORDER — KCL IN DEXTROSE-NACL 20-5-0.45 MEQ/L-%-% IV SOLN
INTRAVENOUS | Status: DC
  Administered 2011-05-14: 18:00:00 via INTRAVENOUS
  Filled 2011-05-14 (×5): qty 1000

## 2011-05-14 MED ORDER — 0.9 % SODIUM CHLORIDE (POUR BTL) OPTIME
TOPICAL | Status: DC | PRN
  Administered 2011-05-14: 1000 mL

## 2011-05-14 MED ORDER — KETOROLAC TROMETHAMINE 30 MG/ML IJ SOLN
INTRAMUSCULAR | Status: AC
  Filled 2011-05-14: qty 1

## 2011-05-14 MED ORDER — HYDROXYZINE HCL 50 MG/ML IM SOLN: 50.0000 mg | INTRAMUSCULAR | Status: DC | PRN

## 2011-05-14 MED ORDER — DEXTROSE 5 % IV SOLN
INTRAVENOUS | Status: DC | PRN
  Administered 2011-05-14: 11:00:00 via INTRAVENOUS

## 2011-05-14 MED ORDER — HYDROMORPHONE HCL PF 1 MG/ML IJ SOLN
INTRAMUSCULAR | Status: AC
  Filled 2011-05-14: qty 1

## 2011-05-14 MED ORDER — KETOROLAC TROMETHAMINE 30 MG/ML IJ SOLN
30.0000 mg | Freq: Once | INTRAMUSCULAR | Status: AC
  Administered 2011-05-14: 30 mg via INTRAVENOUS

## 2011-05-14 MED ORDER — SODIUM CHLORIDE 0.9 % IJ SOLN: 3.0000 mL | INTRAMUSCULAR | Status: DC | PRN

## 2011-05-14 MED ORDER — OXYCODONE-ACETAMINOPHEN 5-325 MG PO TABS
1.0000 | ORAL_TABLET | ORAL | Status: DC | PRN
  Administered 2011-05-14 – 2011-05-15 (×4): 2 via ORAL
  Filled 2011-05-14 (×4): qty 2

## 2011-05-14 MED ORDER — METHYLCELLULOSE (LAXATIVE) 500 MG PO TABS: 1.0000 | ORAL_TABLET | Freq: Every day | ORAL | Status: DC

## 2011-05-14 MED ORDER — MIDAZOLAM HCL 5 MG/5ML IJ SOLN
INTRAMUSCULAR | Status: DC | PRN
  Administered 2011-05-14: 2 mg via INTRAVENOUS

## 2011-05-14 MED ORDER — HYDROMORPHONE HCL PF 1 MG/ML IJ SOLN
0.2500 mg | INTRAMUSCULAR | Status: DC | PRN
  Administered 2011-05-14 (×4): 0.5 mg via INTRAVENOUS

## 2011-05-14 MED ORDER — BACITRACIN 50000 UNITS IM SOLR
INTRAMUSCULAR | Status: AC
  Filled 2011-05-14: qty 50000

## 2011-05-14 MED ORDER — LACTATED RINGERS IV SOLN
INTRAVENOUS | Status: DC | PRN
  Administered 2011-05-14 (×3): via INTRAVENOUS

## 2011-05-14 MED ORDER — SUFENTANIL CITRATE 50 MCG/ML IV SOLN
INTRAVENOUS | Status: DC | PRN
  Administered 2011-05-14: 10 ug via INTRAVENOUS
  Administered 2011-05-14: 20 ug via INTRAVENOUS
  Administered 2011-05-14 (×2): 10 ug via INTRAVENOUS

## 2011-05-14 MED ORDER — HYDROXYZINE HCL 25 MG PO TABS: 50.0000 mg | ORAL_TABLET | ORAL | Status: DC | PRN

## 2011-05-14 MED ORDER — DEXAMETHASONE SODIUM PHOSPHATE 4 MG/ML IJ SOLN
INTRAMUSCULAR | Status: DC | PRN
  Administered 2011-05-14: 4 mg via INTRAVENOUS

## 2011-05-14 MED ORDER — ONDANSETRON HCL 4 MG/2ML IJ SOLN
4.0000 mg | Freq: Four times a day (QID) | INTRAMUSCULAR | Status: DC | PRN
Start: 2011-05-14 — End: 2011-05-14

## 2011-05-14 MED ORDER — ACETAMINOPHEN 325 MG PO TABS: 650.0000 mg | ORAL_TABLET | ORAL | Status: DC | PRN

## 2011-05-14 MED ORDER — SODIUM CHLORIDE 0.9 % IV SOLN
INTRAVENOUS | Status: AC
  Filled 2011-05-14: qty 500

## 2011-05-14 MED ORDER — HYDROMORPHONE HCL PF 1 MG/ML IJ SOLN: 0.2500 mg | INTRAMUSCULAR | Status: DC | PRN

## 2011-05-14 SURGICAL SUPPLY — 53 items
ALLOGRAFT CA 6X14X11 (Bone Implant) ×6 IMPLANT
BAG DECANTER FOR FLEXI CONT (MISCELLANEOUS) ×2 IMPLANT
BIT DRILL NEURO 2X3.1 SFT TUCH (MISCELLANEOUS) ×1 IMPLANT
BLADE ULTRA TIP 2M (BLADE) ×2 IMPLANT
BRUSH SCRUB EZ PLAIN DRY (MISCELLANEOUS) ×2 IMPLANT
CANISTER SUCTION 2500CC (MISCELLANEOUS) ×2 IMPLANT
CLOTH BEACON ORANGE TIMEOUT ST (SAFETY) ×2 IMPLANT
CONT SPEC 4OZ CLIKSEAL STRL BL (MISCELLANEOUS) ×2 IMPLANT
COVER MAYO STAND STRL (DRAPES) ×2 IMPLANT
DECANTER SPIKE VIAL GLASS SM (MISCELLANEOUS) ×2 IMPLANT
DERMABOND ADVANCED (GAUZE/BANDAGES/DRESSINGS) ×1
DERMABOND ADVANCED .7 DNX12 (GAUZE/BANDAGES/DRESSINGS) ×1 IMPLANT
DRAPE LAPAROTOMY 100X72 PEDS (DRAPES) ×2 IMPLANT
DRAPE MICROSCOPE LEICA (MISCELLANEOUS) ×2 IMPLANT
DRAPE POUCH INSTRU U-SHP 10X18 (DRAPES) ×2 IMPLANT
DRAPE PROXIMA HALF (DRAPES) ×4 IMPLANT
DRILL NEURO 2X3.1 SOFT TOUCH (MISCELLANEOUS) ×2
ELECT COATED BLADE 2.86 ST (ELECTRODE) ×2 IMPLANT
ELECT REM PT RETURN 9FT ADLT (ELECTROSURGICAL) ×2
ELECTRODE REM PT RTRN 9FT ADLT (ELECTROSURGICAL) ×1 IMPLANT
GLOVE BIOGEL PI IND STRL 7.5 (GLOVE) ×1 IMPLANT
GLOVE BIOGEL PI IND STRL 8 (GLOVE) ×2 IMPLANT
GLOVE BIOGEL PI INDICATOR 7.5 (GLOVE) ×1
GLOVE BIOGEL PI INDICATOR 8 (GLOVE) ×2
GLOVE ECLIPSE 7.5 STRL STRAW (GLOVE) ×4 IMPLANT
GLOVE EXAM NITRILE LRG STRL (GLOVE) IMPLANT
GLOVE EXAM NITRILE MD LF STRL (GLOVE) ×4 IMPLANT
GLOVE EXAM NITRILE XL STR (GLOVE) IMPLANT
GLOVE EXAM NITRILE XS STR PU (GLOVE) IMPLANT
GLOVE INDICATOR 8.0 STRL GRN (GLOVE) ×4 IMPLANT
GOWN BRE IMP SLV AUR LG STRL (GOWN DISPOSABLE) IMPLANT
GOWN BRE IMP SLV AUR XL STRL (GOWN DISPOSABLE) IMPLANT
GOWN STRL REIN 2XL LVL4 (GOWN DISPOSABLE) ×6 IMPLANT
HEAD HALTER (SOFTGOODS) ×2 IMPLANT
KIT BASIN OR (CUSTOM PROCEDURE TRAY) ×2 IMPLANT
KIT ROOM TURNOVER OR (KITS) ×2 IMPLANT
NEEDLE HYPO 25X1 1.5 SAFETY (NEEDLE) ×2 IMPLANT
NEEDLE SPNL 22GX3.5 QUINCKE BK (NEEDLE) ×6 IMPLANT
NS IRRIG 1000ML POUR BTL (IV SOLUTION) ×2 IMPLANT
PACK LAMINECTOMY NEURO (CUSTOM PROCEDURE TRAY) ×2 IMPLANT
PAD ARMBOARD 7.5X6 YLW CONV (MISCELLANEOUS) ×6 IMPLANT
PATTIES SURGICAL 1X1 (DISPOSABLE) ×2 IMPLANT
RUBBERBAND STERILE (MISCELLANEOUS) ×4 IMPLANT
SPONGE INTESTINAL PEANUT (DISPOSABLE) ×2 IMPLANT
SPONGE SURGIFOAM ABS GEL 100 (HEMOSTASIS) ×2 IMPLANT
SUT VIC AB 0 CT1 18XCR BRD8 (SUTURE) IMPLANT
SUT VIC AB 0 CT1 8-18 (SUTURE)
SUT VIC AB 2-0 CP2 18 (SUTURE) ×4 IMPLANT
SUT VIC AB 3-0 SH 8-18 (SUTURE) ×4 IMPLANT
SYR 20ML ECCENTRIC (SYRINGE) ×2 IMPLANT
TOWEL OR 17X24 6PK STRL BLUE (TOWEL DISPOSABLE) ×2 IMPLANT
TOWEL OR 17X26 10 PK STRL BLUE (TOWEL DISPOSABLE) ×2 IMPLANT
WATER STERILE IRR 1000ML POUR (IV SOLUTION) ×2 IMPLANT

## 2011-05-14 NOTE — Anesthesia Postprocedure Evaluation (Signed)
Anesthesia Post Note  Patient: Reginald House  Procedure(s) Performed:  ANTERIOR CERVICAL DECOMPRESSION/DISCECTOMY FUSION 3 LEVELS - CERVICAL FIVE-SIIX CERVICAL SIX-SEVEN CERVICAL SEVEN THORACIC ONEanterior cervical decompression with fusion plating and bonegraft  Anesthesia type: General  Patient location: PACU  Post pain: Pain level controlled and Adequate analgesia  Post assessment: Post-op Vital signs reviewed, Patient's Cardiovascular Status Stable, Respiratory Function Stable, Patent Airway and Pain level controlled  Last Vitals:  Filed Vitals:   05/14/11 1530  BP:   Pulse: 79  Temp:   Resp: 11    Post vital signs: Reviewed and stable  Level of consciousness: awake, alert  and oriented  Complications: No apparent anesthesia complications

## 2011-05-14 NOTE — Progress Notes (Signed)
Subjective: Patient is a 51 y.o. male who is admitted for treatment of multi-level cervical spondylosis, degenerative disc disease, and disc herniation, with left cervical radiculopathy and weakness of the intrinsics and grip in the left hand.   Patient's symptoms began about 2 months ago. He awoke with pain in the left side of his neck of the left shoulder, arm, and hand. He had numbness in the left fourth and fifth digits of the hand.   Patient was treated with ibuprofen, Valium, and Percocet. He was treated with prednisone 50 mg for 5 days. He had an epidural steroid injection last month which gave him no relief, and in fact none of the medications or treatment have given him much in the way of relief. He finds the Percocet hasn't developed pain slightly but isn't no lasting relief.   He describes weakness in the left hand. Did not describe any paresthesias.   The patient was evaluated with x-rays and MRI scan.   X-rays show multilevel degenerative disc disease and spondylosis, most prominently at C5-6 and C6-7 with disc space narrowing and ventral and dorsal spurring. Oblique images show osteophytic neuroforaminal encroachment, potentially at the left C5-6 and C6-7 levels.   MRI scan showed multilevel degenerative disease and spondylosis, typically at C5-6 and C6-7 with osteophytic neuroforaminal encroachment of the left neural foramen on the left at C5-6 and C6-7. However the MRI shows a focal left C7-T1 foraminal disc herniation.   Patient is admitted now for a three-level C5-6 C6-7 and C7-T1 anterior cervical decompression and arthrodesis.       Patient Active Problem List   Diagnoses  Date Noted   .  PSA, INCREASED  11/28/2009   .  OTHER SYMPTOMS INVOLVING HEAD AND NECK  06/15/2008   .  FATTY LIVER DISEASE  04/22/2008   .  CONTACT DERMATITIS&OTHER ECZEMA DUE UNSPEC CAUSE  04/22/2008   .  NONSPEC ELEVATION OF LEVELS OF TRANSAMINASE/LDH  12/30/2007   .  ADVERSE DRUG REACTION   12/30/2007   .  EXTERNAL HEMORRHOIDS  12/29/2007   .  GERD  12/29/2007   .  HIATAL HERNIA  12/29/2007   .  HYPERLIPIDEMIA  02/03/2007    Past Medical History   Diagnosis  Date   .  Other symptoms involving head and neck     .  Contact dermatitis and other eczema, due to unspecified cause     .  Other chronic nonalcoholic liver disease     .  Unspecified adverse effect of unspecified drug, medicinal and biological substance     .  Nonspecific elevation of levels of transaminase or lactic acid dehydrogenase (LDH)     .  External hemorrhoids without mention of complication     .  Other and unspecified hyperlipidemia     .  Esophageal reflux     .  Hiatal hernia     .  Diverticulitis  2012      Past Surgical History   Procedure  Date   .  Wrist surgery     .  Leg surgery  2009       R leg repair      Prescriptions prior to admission   Medication  Sig  Dispense  Refill   .  esomeprazole (NEXIUM) 40 MG capsule  Take 40 mg by mouth daily before breakfast.          .  Methylcellulose, Laxative, (CITRUCEL) 500 MG TABS  Take 1 tablet by mouth daily.          .    nabumetone (RELAFEN) 500 MG tablet  Take 500 mg by mouth 2 (two) times daily.           .  oxyCODONE-acetaminophen (PERCOCET) 10-650 MG per tablet  Take 1 tablet by mouth every 4 (four) hours as needed. For pain.          .  aspirin 325 MG tablet  Take 325 mg by mouth daily.            Allergies   Allergen  Reactions   .  Ezetimibe  Other (See Comments)       Muscle paralysis     .  Niacin  Other (See Comments)       Muscle paralysis     .  Simvastatin  Other (See Comments)       Muscle paralysis     .  Penicillins         Childhood incident   .  Promethazine         hallucinations      History   Substance Use Topics   .  Smoking status:  Never Smoker    .  Smokeless tobacco:  Never Used   .  Alcohol Use:  0.6 oz/week       1 Glasses of wine per week         social       Family History   Problem  Relation   Age of Onset   .  Colon cancer  Neg Hx     .  Diabetes  Father        Review of Systems A comprehensive review of systems was negative.   Objective: Vital signs in last 24 hours: Temp:  [98.5 F (36.9 C)] 98.5 F (36.9 C) (01/07 0922) Pulse Rate:  [74] 74  (01/07 0922) Resp:  [18] 18  (01/07 0922) BP: (118)/(78) 118/78 mmHg (01/07 0922) SpO2:  [96 %] 96 % (01/07 0922)   EXAM:  Lungs are clear to auscultation , the patient has symmetrical respiratory excursion. Heart has a regular rate and rhythm normal S1 and S2 no murmur.   Abdomen is soft nontender nondistended bowel sounds are present. Extremity examination shows no clubbing cyanosis or edema. Musculoskeletal examination shows no tenderness to palpation over the cervical spinous processes or paracervical musculature. He has a good range of motion neck flexion and extension and lateral flexion to either side, but he has pain with forward flexion and pain with lateral flexion to either side. He does not have any discomfort with extension of the neck. Neurologic examination shows 5 over 5 strength throughout the right upper extremity including the deltoid, biceps, triceps, intrinsics, and grip. However in the left upper extremity the deltoid is 5, the biceps is 4+, but he does limit effort due to pain, triceps is 5, intrinsics and grip are 4 minus over 5, including the abductor digiti minimi. Sensation is intact to pinprick and light touch in the digits of the upper Charlie's. Reflexes are 2 in the biceps, brachialis, triceps, quadriceps, and gastrocnemius. They are symmetrical bilaterally and toes are downgoing bilaterally. He has a normal gait and stance, but he does stand or sit often with his left hand at rest in the top of his head, so that the left shoulder is abducted.   Data Review:CBC    Component  Value  Date/Time     WBC  9.6  05/11/2011 1359     RBC  5.07  05/11/2011 1359       HGB  15.5  05/11/2011 1359     HCT  43.3  05/11/2011  1359     PLT  210  05/11/2011 1359     MCV  85.4  05/11/2011 1359     MCH  30.6  05/11/2011 1359     MCHC  35.8  05/11/2011 1359     RDW  13.1  05/11/2011 1359     LYMPHSABS  1.7  11/21/2009 0814     MONOABS  0.7  11/21/2009 0814     EOSABS  0.1  11/21/2009 0814     BASOSABS  0.0  11/21/2009 0814                            BMET    Component  Value  Date/Time     NA  140  05/11/2011 1359     K  4.2  05/11/2011 1359     CL  101  05/11/2011 1359     CO2  27  05/11/2011 1359     GLUCOSE  103*  05/11/2011 1359     BUN  13  05/11/2011 1359     CREATININE  1.11  05/11/2011 1359     CALCIUM  10.4  05/11/2011 1359     GFRNONAA  75*  05/11/2011 1359     GFRAA  87*  05/11/2011 1359        Assessment/Plan: Patient with a left cervical radiculopathy with weakness of the intrinsics and grip on the left with multilevel degenerative disc disease and spondylosis, typically at C5-6 and C6-7 with left sided neural foraminal stenosis due to osteophytic overgrowth on the left at C5-6 and C6-7, but also a left C7-T1 foraminal disc herniation.   We have discussed options of further nonsurgical management versus surgical intervention. We discussed to him surgical approaches including a left C7-T1 posterior cervical laminotomy, foraminotomy, and microdiscectomy versus a three-level C5-6, C6-7, and C7-T1 anterior cervical decompression and arthrodesis with allograft and cervical plating. We discussed the advantages and disadvantages of each approach and the risks of each approach. After discussing all this the patient does wish to proceed with a three-level C5-6, C6-7, and C7-T1 ACDF.   I've discussed with the patient the nature of his condition, the nature the surgical procedure, the typical length of surgery, hospital stay, and overall recuperation. We discussed limitations postoperatively. I discussed risks of surgery including risks of infection, bleeding, possibly need for transfusion, the risk of nerve root dysfunction with pain,  weakness, numbness, or paresthesias, the risk of spinal cord dysfunction with paralysis of all 4 limbs and quadriplegia, and the risk of dural tear and CSF leakage and possible need for further surgery, the risk of esophageal dysfunction causing dysphagia and the risk of laryngeal dysfunction causing hoarseness of the voice, the risk of failure of the arthrodesis and the possible need for further surgery, and the risk of anesthetic complications including myocardial infarction, stroke, pneumonia, and death. We also discussed the need for postoperative immobilization in a cervical collar. Understanding all this the patient does wish to proceed with surgery and is admitted for such.       NUDELMAN,ROBERT W, MD 05/14/2011 9:55 AM       

## 2011-05-14 NOTE — Anesthesia Preprocedure Evaluation (Signed)
Anesthesia Evaluation  Patient identified by MRN, date of birth, ID band Patient awake    Reviewed: Allergy & Precautions, H&P , NPO status , Patient's Chart, lab work & pertinent test results  Airway Mallampati: II  Neck ROM: full    Dental   Pulmonary          Cardiovascular     Neuro/Psych  Neuromuscular disease    GI/Hepatic hiatal hernia, GERD-  ,  Endo/Other    Renal/GU      Musculoskeletal   Abdominal   Peds  Hematology   Anesthesia Other Findings   Reproductive/Obstetrics                           Anesthesia Physical Anesthesia Plan  ASA: II  Anesthesia Plan: General   Post-op Pain Management:    Induction: Intravenous  Airway Management Planned: Oral ETT  Additional Equipment:   Intra-op Plan:   Post-operative Plan: Extubation in OR  Informed Consent: I have reviewed the patients History and Physical, chart, labs and discussed the procedure including the risks, benefits and alternatives for the proposed anesthesia with the patient or authorized representative who has indicated his/her understanding and acceptance.     Plan Discussed with: CRNA and Surgeon  Anesthesia Plan Comments:         Anesthesia Quick Evaluation

## 2011-05-14 NOTE — Transfer of Care (Signed)
Immediate Anesthesia Transfer of Care Note  Patient: Reginald House  Procedure(s) Performed:  ANTERIOR CERVICAL DECOMPRESSION/DISCECTOMY FUSION 3 LEVELS - CERVICAL FIVE-SIIX CERVICAL SIX-SEVEN CERVICAL SEVEN THORACIC ONEanterior cervical decompression with fusion plating and bonegraft  Patient Location: PACU  Anesthesia Type: General  Level of Consciousness: awake, oriented, sedated, patient cooperative and responds to stimulation  Airway & Oxygen Therapy: Patient Spontanous Breathing and Patient connected to nasal cannula oxygen  Post-op Assessment: Report given to PACU RN, Post -op Vital signs reviewed and stable and Patient moving all extremities  Post vital signs: Reviewed and stable  Complications: No apparent anesthesia complications

## 2011-05-14 NOTE — Progress Notes (Signed)
Filed Vitals:   05/14/11 1549 05/14/11 1630 05/14/11 1650 05/14/11 1800  BP:   148/84 148/91  Pulse: 82 81 90 86  Temp:  98 F (36.7 C) 97.5 F (36.4 C) 98.7 F (37.1 C)  TempSrc:    Oral  Resp: 14 13 14 20   SpO2: 98% 98% 94% 93%     Patient resting comfortably in bed. His Foley has been discontinued about 3 hours ago and he has already voided twice since then. He is ambulating several times in the hallway. His wound is healing well there is no swelling erythema or drainage. He had a light dinner without any significant dysphagia.   Plan: Patient making good progress, encouraged to continue to ambulate. We'll reassess in a.m.

## 2011-05-14 NOTE — Anesthesia Procedure Notes (Signed)
Procedures

## 2011-05-14 NOTE — H&P (Signed)
Subjective: Patient is a 51 y.o. male who is admitted for treatment of multi-level cervical spondylosis, degenerative disc disease, and disc herniation, with left cervical radiculopathy and weakness of the intrinsics and grip in the left hand.   Patient's symptoms began about 2 months ago. He awoke with pain in the left side of his neck of the left shoulder, arm, and hand. He had numbness in the left fourth and fifth digits of the hand.   Patient was treated with ibuprofen, Valium, and Percocet. He was treated with prednisone 50 mg for 5 days. He had an epidural steroid injection last month which gave him no relief, and in fact none of the medications or treatment have given him much in the way of relief. He finds the Percocet hasn't developed pain slightly but isn't no lasting relief.   He describes weakness in the left hand. Did not describe any paresthesias.   The patient was evaluated with x-rays and MRI scan.   X-rays show multilevel degenerative disc disease and spondylosis, most prominently at C5-6 and C6-7 with disc space narrowing and ventral and dorsal spurring. Oblique images show osteophytic neuroforaminal encroachment, potentially at the left C5-6 and C6-7 levels.   MRI scan showed multilevel degenerative disease and spondylosis, typically at C5-6 and C6-7 with osteophytic neuroforaminal encroachment of the left neural foramen on the left at C5-6 and C6-7. However the MRI shows a focal left C7-T1 foraminal disc herniation.   Patient is admitted now for a three-level C5-6 C6-7 and C7-T1 anterior cervical decompression and arthrodesis.       Patient Active Problem List   Diagnoses  Date Noted   .  PSA, INCREASED  11/28/2009   .  OTHER SYMPTOMS INVOLVING HEAD AND NECK  06/15/2008   .  FATTY LIVER DISEASE  04/22/2008   .  CONTACT DERMATITIS&OTHER ECZEMA DUE UNSPEC CAUSE  04/22/2008   .  NONSPEC ELEVATION OF LEVELS OF TRANSAMINASE/LDH  12/30/2007   .  ADVERSE DRUG REACTION   12/30/2007   .  EXTERNAL HEMORRHOIDS  12/29/2007   .  GERD  12/29/2007   .  HIATAL HERNIA  12/29/2007   .  HYPERLIPIDEMIA  02/03/2007    Past Medical History   Diagnosis  Date   .  Other symptoms involving head and neck     .  Contact dermatitis and other eczema, due to unspecified cause     .  Other chronic nonalcoholic liver disease     .  Unspecified adverse effect of unspecified drug, medicinal and biological substance     .  Nonspecific elevation of levels of transaminase or lactic acid dehydrogenase (LDH)     .  External hemorrhoids without mention of complication     .  Other and unspecified hyperlipidemia     .  Esophageal reflux     .  Hiatal hernia     .  Diverticulitis  2012      Past Surgical History   Procedure  Date   .  Wrist surgery     .  Leg surgery  2009       R leg repair      Prescriptions prior to admission   Medication  Sig  Dispense  Refill   .  esomeprazole (NEXIUM) 40 MG capsule  Take 40 mg by mouth daily before breakfast.          .  Methylcellulose, Laxative, (CITRUCEL) 500 MG TABS  Take 1 tablet by mouth daily.          Marland Kitchen  nabumetone (RELAFEN) 500 MG tablet  Take 500 mg by mouth 2 (two) times daily.           Marland Kitchen  oxyCODONE-acetaminophen (PERCOCET) 10-650 MG per tablet  Take 1 tablet by mouth every 4 (four) hours as needed. For pain.          Marland Kitchen  aspirin 325 MG tablet  Take 325 mg by mouth daily.            Allergies   Allergen  Reactions   .  Ezetimibe  Other (See Comments)       Muscle paralysis     .  Niacin  Other (See Comments)       Muscle paralysis     .  Simvastatin  Other (See Comments)       Muscle paralysis     .  Penicillins         Childhood incident   .  Promethazine         hallucinations      History   Substance Use Topics   .  Smoking status:  Never Smoker    .  Smokeless tobacco:  Never Used   .  Alcohol Use:  0.6 oz/week       1 Glasses of wine per week         social       Family History   Problem  Relation   Age of Onset   .  Colon cancer  Neg Hx     .  Diabetes  Father        Review of Systems A comprehensive review of systems was negative.   Objective: Vital signs in last 24 hours: Temp:  [98.5 F (36.9 C)] 98.5 F (36.9 C) (01/07 0922) Pulse Rate:  [74] 74  (01/07 0922) Resp:  [18] 18  (01/07 0922) BP: (118)/(78) 118/78 mmHg (01/07 0922) SpO2:  [96 %] 96 % (01/07 0922)   EXAM:  Lungs are clear to auscultation , the patient has symmetrical respiratory excursion. Heart has a regular rate and rhythm normal S1 and S2 no murmur.   Abdomen is soft nontender nondistended bowel sounds are present. Extremity examination shows no clubbing cyanosis or edema. Musculoskeletal examination shows no tenderness to palpation over the cervical spinous processes or paracervical musculature. He has a good range of motion neck flexion and extension and lateral flexion to either side, but he has pain with forward flexion and pain with lateral flexion to either side. He does not have any discomfort with extension of the neck. Neurologic examination shows 5 over 5 strength throughout the right upper extremity including the deltoid, biceps, triceps, intrinsics, and grip. However in the left upper extremity the deltoid is 5, the biceps is 4+, but he does limit effort due to pain, triceps is 5, intrinsics and grip are 4 minus over 5, including the abductor digiti minimi. Sensation is intact to pinprick and light touch in the digits of the upper Charlie's. Reflexes are 2 in the biceps, brachialis, triceps, quadriceps, and gastrocnemius. They are symmetrical bilaterally and toes are downgoing bilaterally. He has a normal gait and stance, but he does stand or sit often with his left hand at rest in the top of his head, so that the left shoulder is abducted.   Data Review:CBC    Component  Value  Date/Time     WBC  9.6  05/11/2011 1359     RBC  5.07  05/11/2011 1359  HGB  15.5  05/11/2011 1359     HCT  43.3  05/11/2011  1359     PLT  210  05/11/2011 1359     MCV  85.4  05/11/2011 1359     MCH  30.6  05/11/2011 1359     MCHC  35.8  05/11/2011 1359     RDW  13.1  05/11/2011 1359     LYMPHSABS  1.7  11/21/2009 0814     MONOABS  0.7  11/21/2009 0814     EOSABS  0.1  11/21/2009 0814     BASOSABS  0.0  11/21/2009 0814                            BMET    Component  Value  Date/Time     NA  140  05/11/2011 1359     K  4.2  05/11/2011 1359     CL  101  05/11/2011 1359     CO2  27  05/11/2011 1359     GLUCOSE  103*  05/11/2011 1359     BUN  13  05/11/2011 1359     CREATININE  1.11  05/11/2011 1359     CALCIUM  10.4  05/11/2011 1359     GFRNONAA  75*  05/11/2011 1359     GFRAA  87*  05/11/2011 1359        Assessment/Plan: Patient with a left cervical radiculopathy with weakness of the intrinsics and grip on the left with multilevel degenerative disc disease and spondylosis, typically at C5-6 and C6-7 with left sided neural foraminal stenosis due to osteophytic overgrowth on the left at C5-6 and C6-7, but also a left C7-T1 foraminal disc herniation.   We have discussed options of further nonsurgical management versus surgical intervention. We discussed to him surgical approaches including a left C7-T1 posterior cervical laminotomy, foraminotomy, and microdiscectomy versus a three-level C5-6, C6-7, and C7-T1 anterior cervical decompression and arthrodesis with allograft and cervical plating. We discussed the advantages and disadvantages of each approach and the risks of each approach. After discussing all this the patient does wish to proceed with a three-level C5-6, C6-7, and C7-T1 ACDF.   I've discussed with the patient the nature of his condition, the nature the surgical procedure, the typical length of surgery, hospital stay, and overall recuperation. We discussed limitations postoperatively. I discussed risks of surgery including risks of infection, bleeding, possibly need for transfusion, the risk of nerve root dysfunction with pain,  weakness, numbness, or paresthesias, the risk of spinal cord dysfunction with paralysis of all 4 limbs and quadriplegia, and the risk of dural tear and CSF leakage and possible need for further surgery, the risk of esophageal dysfunction causing dysphagia and the risk of laryngeal dysfunction causing hoarseness of the voice, the risk of failure of the arthrodesis and the possible need for further surgery, and the risk of anesthetic complications including myocardial infarction, stroke, pneumonia, and death. We also discussed the need for postoperative immobilization in a cervical collar. Understanding all this the patient does wish to proceed with surgery and is admitted for such.       Hewitt Shorts, MD 05/14/2011 9:55 AM

## 2011-05-14 NOTE — Op Note (Signed)
05/14/2011  2:01 PM  PATIENT:  Reginald House  51 y.o. male  PRE-OPERATIVE DIAGNOSIS:  cervical herniated disc, left C7-T1, cervical spondylosis, cervical degenerative disc disease, left C8 radiculopathy with weakness and atrophy  POST-OPERATIVE DIAGNOSIS:   cervical herniated disc, left C7-T1, cervical spondylosis, cervical degenerative disc disease, left C8 radiculopathy with weakness and atrophy   PROCEDURE:  Procedure(s): ANTERIOR CERVICAL DECOMPRESSION/DISCECTOMY FUSION 3 LEVELS, C5-6, C6-7, C7-T1, with allograft and tether cervical plating  SURGEON:  Surgeon(s): Reatha Harps Elsner  ASSISTANTS: Stefani Dama  ANESTHESIA:   general  EBL:  Total I/O In: 2200 [I.V.:2200] Out: 360 [Urine:260; Blood:100]  BLOOD ADMINISTERED:none  CELL SAVER GIVEN: None  COUNT: Correct per nursing staff  DRAINS: none   SPECIMEN:  No Specimen  DICTATION: Patient was brought to the operating room placed under general endotracheal anesthesia. Patient was placed in 10 pounds of halter traction. The neck was prepped with Betadine soap and solution and draped in a sterile fashion. A obliquel incision was made on the left side of the neck paralleling the anterior border of the sternocleidomastoid.. The line of the incision was infiltrated with local anesthetic with epinephrine. Dissection was carried down thru the subcutaneous tissue and platysma, bipolar cautery was used to maintain hemostasis. Dissection was then carried out thru an avascular plane leaving the sternocleidomastoid carotid artery and jugular vein laterally and the trachea and esophagus medially. The ventral aspect of the vertebral column was identified and a localizing x-ray was taken. The C5-6, C6-7, and C7-T1 levels were identified. The annulus at each level was incised and the disc space entered. Discectomy was performed with micro-curettes and pituitary rongeurs. The operating microscope was draped and brought into the  field provided additional magnification illumination and visualization. Discectomy was continued posteriorly thru the disc space and then the cartilaginous endplate was removed using micro-curettes along with the high-speed drill. Posterior osteophytic overgrowth was removed at each level using the high-speed drill along with a 2 mm thin footplated Kerrison punch. Posterior longitudinal ligament along with disc herniation was carefully removed, decompressing the spinal canal and thecal sac. We then continued to remove osteophytic overgrowth and disc material decompressing the neural foramina and exiting nerve roots bilaterally. At the C7-T1 level on the left side, we found a large disc herniation compressing the left C8 nerve root and this was carefully removed, and was felt to be the primary cause of his left C8 radiculopathy with weakness and atrophy. Once the decompression was completed hemostasis was established at each level with the use of Gelfoam with thrombin and bipolar cautery. The Gelfoam was removed the wound irrigated and hemostasis confirmed. We then measured the height of each intravertebral disc space level and selected a 6 millimeter in height structural allograft for each level. Each was hydrated in saline solution and then gently positioned in the intravertebral disc space and countersunk. We then selected a 52 millimeter in height Tether cervical plate. It was positioned over the fusion construct and secured to the vertebra with 2 4 x 14 mm fixed screws at the C5, 1 4 x 14 mmvariable screw at C6-1 4 x 15 mm fixed screw at C7 and 2 4 x 15 mm variable  screws At T1. Each screw hole was started with the high-speed drill and then the screws placed, once all the screws were placed final tightening was performed. The wound was irrigated with bacitracin solution checked for hemostasis which was established and confirmed. An x-ray was taken  which showed only the upper portion of the construct which  showed the screws in good position at C5, the overall alignment appeared good . We then proceeded with closure. The platysma was closed with interrupted inverted 2-0 undyed Vicryl suture, the subcutaneous and subcuticular closed with interrupted inverted 3-0 undyed Vicryl suture. The skin edges were approximated with Dermabond. Following surgery the patient was taken out of cervical traction. To be reversed and the anesthetic and taken to the recovery room for further care.   PLAN OF CARE: Admit for overnight observation  PATIENT DISPOSITION:  PACU - hemodynamically stable.   Delay start of Pharmacological VTE agent (>24hrs) due to surgical blood loss or risk of bleeding:  yes

## 2011-05-15 ENCOUNTER — Encounter (HOSPITAL_COMMUNITY): Payer: Self-pay | Admitting: Neurosurgery

## 2011-05-15 MED ORDER — CYCLOBENZAPRINE HCL 10 MG PO TABS: 10.0000 mg | ORAL_TABLET | Freq: Three times a day (TID) | ORAL | Status: AC | PRN

## 2011-05-15 MED ORDER — OXYCODONE-ACETAMINOPHEN 5-325 MG PO TABS: 1.0000 | ORAL_TABLET | ORAL | Status: AC | PRN

## 2011-05-15 NOTE — Discharge Summary (Signed)
Physician Discharge Summary  Patient ID: Reginald House MRN: [id number] DOB/AGE: [birth year] 51 y.o.  Admit date: 05/14/2011 Discharge date: 05/15/2011  Admission Diagnoses: Cervical herniated disc, left C7-T1; cervical spondylosis; cervical degenerative disc disease; left C8 cervical radiculopathy with weakness and atrophy Discharge Diagnoses: Cervical herniated disc, left C7-T1; cervical spondylosis; cervical degenerative disc disease; left C8 cervical radiculopathy with weakness and atrophy  Principal Problem:  *HNP (herniated nucleus pulposus), cervical   Discharged Condition: good  Hospital Course:  Patient was admitted and underwent a three-level C5-6 C6-7 and C7-T1 anterior cervical decompression and arthrodesis with allograft and tether cervical plating. He has done well following surgery.he is up and ambulate actively in the halls, he does have some discomfort in the midline at the base of his neck and upper back. His wound is clean and dry and healing well. He is voiding well.  Discharge instructions regarding wound care and activities have been explained to the patient and his spouse.  He and his wife's questions were answered for them.   Consults: none  Significant Diagnostic Studies: None   Discharge Exam: Blood pressure 125/81, pulse 88, temperature 98.4 F (36.9 C), temperature source Oral, resp. rate 16, SpO2 96.00%. There remains atrophy and weakness in the left hand intrinsic musculature, consistent with his left C8 cervical radiculopathy.   Disposition:  home    Current Discharge Medication List    START taking these medications   Details  cyclobenzaprine (FLEXERIL) 10 MG tablet Take 1 tablet (10 mg total) by mouth 3 (three) times daily as needed for muscle spasms. Qty: 60 tablet, Refills: 1    oxyCODONE-acetaminophen (PERCOCET) 5-325 MG per tablet Take 1-2 tablets by mouth every 4 (four) hours as needed for pain. Qty: 60 tablet, Refills: 0      CONTINUE these  medications which have NOT CHANGED   Details  esomeprazole (NEXIUM) 40 MG capsule Take 40 mg by mouth daily before breakfast.     Methylcellulose, Laxative, (CITRUCEL) 500 MG TABS Take 1 tablet by mouth daily.     aspirin 325 MG tablet Take 325 mg by mouth daily.        STOP taking these medications     nabumetone (RELAFEN) 500 MG tablet      oxyCODONE-acetaminophen (PERCOCET) 10-650 MG per tablet          Signed: Hewitt Shorts, MD 05/15/2011, 8:02 AM

## 2011-05-22 ENCOUNTER — Encounter: Payer: Self-pay | Admitting: Gastroenterology

## 2011-09-04 ENCOUNTER — Other Ambulatory Visit: Payer: Self-pay | Admitting: Gastroenterology

## 2012-07-07 ENCOUNTER — Other Ambulatory Visit: Payer: Self-pay | Admitting: Gastroenterology

## 2012-07-07 NOTE — Telephone Encounter (Signed)
10-17-2010----Note in system stating patient was going to transfer to [facility].. I called [facility] and they do not have record of patient being seen there.  There was a refill done on 09-04-2011.  I tried contacting patient and had to leave a message.  Sent refill with notice for patient to call the office.

## 2012-08-12 ENCOUNTER — Encounter: Payer: Self-pay | Admitting: *Deleted

## 2012-08-19 ENCOUNTER — Encounter: Payer: Self-pay | Admitting: Gastroenterology

## 2012-08-19 ENCOUNTER — Ambulatory Visit (INDEPENDENT_AMBULATORY_CARE_PROVIDER_SITE_OTHER): Payer: BC Managed Care – PPO | Admitting: Gastroenterology

## 2012-08-19 VITALS — BP 110/70 | HR 69 | Ht 67.0 in | Wt 201.2 lb

## 2012-08-19 DIAGNOSIS — K219 Gastro-esophageal reflux disease without esophagitis: Secondary | ICD-10-CM

## 2012-08-19 MED ORDER — ESOMEPRAZOLE MAGNESIUM 40 MG PO CPDR: DELAYED_RELEASE_CAPSULE | ORAL | Status: DC

## 2012-08-19 NOTE — Patient Instructions (Addendum)
We have sent the following medications to your pharmacy for you to pick up at your convenience:  Nexium Follow up with Dr Jarold Motto as needed. CC: Jacinta Shoe MD

## 2012-08-19 NOTE — Progress Notes (Signed)
History of Present Illness: This is a 52 year old Caucasian male who is status post sphincterotomy for a Zenker's diverticulum.  She has a known small hiatal hernia and is on Nexium 40 mg a day and is asymptomatic as long as he takes his daily PPI.  He denies chest pain, dysphagia, bad breath, or other gastrointestinal or general medical problems.  He also has had cervical disc repair surgery by neurosurgery. He  sees primary care gets yearly blood counts done which have been normal.    Current Medications, Allergies, Past Medical History, Past Surgical History, Family History and Social History were reviewed in [employer] record.  ROS: All systems were reviewed and are negative unless otherwise stated in the HPI.         Assessment and plan: GERD well-controlled with daily Nexium therapy.  I've renewed his medication and reviewed chronic reflux regime with the patient.  We will see him on a when necessary basis as needed.  He had his endoscopic sphincterotomy of his upper esophageal sphincter performed at [facility]. No diagnosis found.

## 2013-01-27 ENCOUNTER — Other Ambulatory Visit (INDEPENDENT_AMBULATORY_CARE_PROVIDER_SITE_OTHER): Payer: BC Managed Care – PPO

## 2013-01-27 ENCOUNTER — Encounter: Payer: Self-pay | Admitting: Internal Medicine

## 2013-01-27 ENCOUNTER — Ambulatory Visit (INDEPENDENT_AMBULATORY_CARE_PROVIDER_SITE_OTHER): Payer: BC Managed Care – PPO | Admitting: Internal Medicine

## 2013-01-27 VITALS — BP 136/98 | HR 80 | Temp 99.2°F | Resp 16 | Ht 67.0 in | Wt 196.0 lb

## 2013-01-27 DIAGNOSIS — E785 Hyperlipidemia, unspecified: Secondary | ICD-10-CM

## 2013-01-27 DIAGNOSIS — Z23 Encounter for immunization: Secondary | ICD-10-CM

## 2013-01-27 DIAGNOSIS — Z0000 Encounter for general adult medical examination without abnormal findings: Secondary | ICD-10-CM

## 2013-01-27 DIAGNOSIS — R972 Elevated prostate specific antigen [PSA]: Secondary | ICD-10-CM

## 2013-01-27 DIAGNOSIS — M5020 Other cervical disc displacement, unspecified cervical region: Secondary | ICD-10-CM

## 2013-01-27 DIAGNOSIS — L259 Unspecified contact dermatitis, unspecified cause: Secondary | ICD-10-CM

## 2013-01-27 DIAGNOSIS — Z2911 Encounter for prophylactic immunotherapy for respiratory syncytial virus (RSV): Secondary | ICD-10-CM

## 2013-01-27 LAB — HEPATIC FUNCTION PANEL
ALT: 45 U/L (ref 0–53)
AST: 22 U/L (ref 0–37)
Alkaline Phosphatase: 56 U/L (ref 39–117)
Total Bilirubin: 0.9 mg/dL (ref 0.3–1.2)

## 2013-01-27 LAB — URINALYSIS
Bilirubin Urine: NEGATIVE
Hgb urine dipstick: NEGATIVE
Ketones, ur: NEGATIVE
Nitrite: NEGATIVE
Total Protein, Urine: NEGATIVE
Urine Glucose: NEGATIVE
pH: 7.5 (ref 5.0–8.0)

## 2013-01-27 LAB — CBC WITH DIFFERENTIAL/PLATELET
Basophils Relative: 0.5 % (ref 0.0–3.0)
Eosinophils Absolute: 0.1 10*3/uL (ref 0.0–0.7)
Eosinophils Relative: 1.5 % (ref 0.0–5.0)
Lymphocytes Relative: 31.7 % (ref 12.0–46.0)
MCHC: 34.4 g/dL (ref 30.0–36.0)
MCV: 86.4 fl (ref 78.0–100.0)
Monocytes Absolute: 0.6 10*3/uL (ref 0.1–1.0)
Neutrophils Relative %: 54.9 % (ref 43.0–77.0)
Platelets: 223 10*3/uL (ref 150.0–400.0)
RBC: 5.05 Mil/uL (ref 4.22–5.81)
WBC: 5.5 10*3/uL (ref 4.5–10.5)

## 2013-01-27 LAB — BASIC METABOLIC PANEL
BUN: 13 mg/dL (ref 6–23)
Chloride: 103 mEq/L (ref 96–112)
GFR: 75.16 mL/min (ref >60.00)
Potassium: 4 mEq/L (ref 3.5–5.1)
Sodium: 140 mEq/L (ref 135–145)

## 2013-01-27 MED ORDER — VITAMIN D 1000 UNITS PO TABS: 1000.0000 [IU] | ORAL_TABLET | Freq: Every day | ORAL | Status: DC

## 2013-01-27 NOTE — Assessment & Plan Note (Signed)
NMR Good diet

## 2013-01-27 NOTE — Assessment & Plan Note (Signed)
We discussed age appropriate health related issues, including available/recomended screening tests and vaccinations. We discussed a need for adhering to healthy diet and exercise. Labs/EKG were reviewed/ordered. All questions were answered.   

## 2013-01-27 NOTE — Patient Instructions (Addendum)
For rash:  Gluten free trial (no wheat products) for 4-6 weeks. OK to use gluten-free bread and gluten-free pasta.  Milk free trial (no milk, ice cream, cheese and yogurt) for 4-6 weeks. OK to use almond, coconut, rice or soy milk. "Almond breeze" brand tastes good.  Contour pillow

## 2013-01-27 NOTE — Progress Notes (Signed)
Subjective:    Patient ID: Reginald House, male    DOB: 12-21-1960, 52 y.o.   MRN: [id number]  HPI  The patient is here for a wellness exam. The patient has been doing well overall without major physical or psychological issues going on lately.  BP Readings from Last 3 Encounters:  01/27/13 136/98  08/19/12 110/70  05/15/11 119/70   Wt Readings from Last 3 Encounters:  01/27/13 196 lb (88.905 kg)  08/19/12 201 lb 3.2 oz (91.264 kg)  05/11/11 189 lb 9.5 oz (86 kg)      Review of Systems  Constitutional: Negative for appetite change, fatigue and unexpected weight change.  HENT: Negative for nosebleeds, congestion, sore throat, sneezing, trouble swallowing and neck pain.   Eyes: Negative for itching and visual disturbance.  Respiratory: Negative for cough.   Cardiovascular: Negative for chest pain, palpitations and leg swelling.  Gastrointestinal: Negative for nausea, diarrhea, blood in stool and abdominal distention.  Genitourinary: Negative for frequency and hematuria.  Musculoskeletal: Negative for back pain, joint swelling and gait problem.  Skin: Negative for rash.  Neurological: Negative for dizziness, tremors, speech difficulty and weakness.  Psychiatric/Behavioral: Negative for sleep disturbance, dysphoric mood and agitation. The patient is not nervous/anxious.    Lab Results  Component Value Date   WBC 9.6 05/11/2011   HGB 15.5 05/11/2011   HCT 43.3 05/11/2011   PLT 210 05/11/2011   GLUCOSE 103* 05/11/2011   CHOL 266* 08/22/2010   TRIG 212.0* 08/22/2010   HDL 41.90 08/22/2010   LDLDIRECT 185.3 08/22/2010   ALT 48 11/21/2009   AST 26 11/21/2009   NA 140 05/11/2011   K 4.2 05/11/2011   CL 101 05/11/2011   CREATININE 1.11 05/11/2011   BUN 13 05/11/2011   CO2 27 05/11/2011   TSH 2.05 11/21/2009   PSA 4.73* 08/22/2010   INR 1.0 07/14/2008       Objective:   Physical Exam  Constitutional: He is oriented to person, place, and time. He appears well-developed and well-nourished. No distress.   HENT:  Head: Normocephalic and atraumatic.  Right Ear: External ear normal.  Left Ear: External ear normal.  Nose: Nose normal.  Mouth/Throat: Oropharynx is clear and moist. No oropharyngeal exudate.  Eyes: Conjunctivae and EOM are normal. Pupils are equal, round, and reactive to light. Right eye exhibits no discharge. Left eye exhibits no discharge. No scleral icterus.  Neck: Normal range of motion. Neck supple. No JVD present. No tracheal deviation present. No thyromegaly present.  Cardiovascular: Normal rate, regular rhythm, normal heart sounds and intact distal pulses.  Exam reveals no gallop and no friction rub.   No murmur heard. Pulmonary/Chest: Effort normal and breath sounds normal. No stridor. No respiratory distress. He has no wheezes. He has no rales. He exhibits no tenderness.  Abdominal: Soft. Bowel sounds are normal. He exhibits no distension and no mass. There is no tenderness. There is no rebound and no guarding.  Genitourinary: Rectum normal, prostate normal and penis normal. Guaiac negative stool. No penile tenderness.  Musculoskeletal: Normal range of motion. He exhibits no edema and no tenderness.  Lymphadenopathy:    He has no cervical adenopathy.  Neurological: He is alert and oriented to person, place, and time. He has normal reflexes. No cranial nerve deficit. He exhibits normal muscle tone. Coordination normal.  Skin: Skin is warm and dry. No rash noted. He is not diaphoretic. No erythema. No pallor.  Psychiatric: He has a normal mood and affect. His behavior is  normal. Judgment and thought content normal.    Lab Results  Component Value Date   WBC 9.6 05/11/2011   HGB 15.5 05/11/2011   HCT 43.3 05/11/2011   PLT 210 05/11/2011   GLUCOSE 103* 05/11/2011   CHOL 266* 08/22/2010   TRIG 212.0* 08/22/2010   HDL 41.90 08/22/2010   LDLDIRECT 185.3 08/22/2010   ALT 48 11/21/2009   AST 26 11/21/2009   NA 140 05/11/2011   K 4.2 05/11/2011   CL 101 05/11/2011   CREATININE 1.11 05/11/2011    BUN 13 05/11/2011   CO2 27 05/11/2011   TSH 2.05 11/21/2009   PSA 4.73* 08/22/2010   INR 1.0 07/14/2008         Assessment & Plan:

## 2013-01-28 LAB — NMR LIPOPROFILE WITHOUT LIPIDS
HDL Size: 8.5 nm — ABNORMAL LOW (ref >=9.2)
LDL Size: 20.2 nm — ABNORMAL LOW (ref >20.5)
LP-IR Score: 64 — ABNORMAL HIGH (ref <=45)
Large VLDL-P: 2.1 nmol/L (ref <=2.7)
Small LDL Particle Number: 1471 nmol/L — ABNORMAL HIGH (ref <=527)
VLDL Size: 43.7 nm (ref <=46.6)

## 2013-01-28 NOTE — Assessment & Plan Note (Signed)
NMR Cardiol consult Statin, Zetia intolerant

## 2013-01-28 NOTE — Assessment & Plan Note (Signed)
We discussed age appropriate health related issues, including available/recomended screening tests and vaccinations. We discussed a need for adhering to healthy diet and exercise. Labs/EKG were reviewed/ordered. All questions were answered.   

## 2013-01-28 NOTE — Assessment & Plan Note (Signed)
Contour pillow  

## 2013-01-28 NOTE — Assessment & Plan Note (Signed)
Resolved

## 2013-01-28 NOTE — Assessment & Plan Note (Signed)
Dr Tannenbaum S/p prostate bx  

## 2013-01-28 NOTE — Assessment & Plan Note (Signed)
Gluten free trial (no wheat products) for 4-6 weeks. OK to use gluten-free bread and gluten-free pasta.  Milk free trial (no milk, ice cream, cheese and yogurt) for 4-6 weeks. OK to use almond, coconut, rice or soy milk. "Almond breeze" brand tastes good.  

## 2013-02-01 NOTE — Assessment & Plan Note (Signed)
Dr Patsi Sears S/p prostate bx

## 2013-03-26 ENCOUNTER — Ambulatory Visit (INDEPENDENT_AMBULATORY_CARE_PROVIDER_SITE_OTHER): Payer: 59 | Admitting: Cardiology

## 2013-03-26 ENCOUNTER — Encounter: Payer: Self-pay | Admitting: Cardiology

## 2013-03-26 ENCOUNTER — Encounter (INDEPENDENT_AMBULATORY_CARE_PROVIDER_SITE_OTHER): Payer: Self-pay

## 2013-03-26 ENCOUNTER — Encounter: Payer: Self-pay | Admitting: *Deleted

## 2013-03-26 VITALS — BP 129/82 | HR 76 | Ht 67.0 in | Wt 195.0 lb

## 2013-03-26 DIAGNOSIS — R079 Chest pain, unspecified: Secondary | ICD-10-CM

## 2013-03-26 NOTE — Progress Notes (Signed)
Patient ID: MASSEY RUHLAND, male   DOB: [birth year], 52 y.o.   MRN: [id number]     Patient Name: Reginald House Date of Encounter: 03/26/2013  Primary Care Provider:  Sonda Primes, MD Primary Cardiologist:  Tobias Alexander, H  Problem List   Past Medical History  Diagnosis Date  . Other symptoms involving head and neck(784.99)   . Contact dermatitis and other eczema, due to unspecified cause   . Other chronic nonalcoholic liver disease   . Unspecified adverse effect of unspecified drug, medicinal and biological substance   . Nonspecific elevation of levels of transaminase or lactic acid dehydrogenase (LDH)   . External hemorrhoids without mention of complication   . Other and unspecified hyperlipidemia   . Esophageal reflux   . Hiatal hernia   . Diverticulitis 2012  . Esophagitis    Past Surgical History  Procedure Laterality Date  . Wrist surgery    . Leg surgery  2009    R leg repair  . Anterior cervical decomp/discectomy fusion  05/14/2011    Procedure: ANTERIOR CERVICAL DECOMPRESSION/DISCECTOMY FUSION 3 LEVELS;  Surgeon: Hewitt Shorts;  Location: [facility];  Service: Neurosurgery;  Laterality: N/A;  CERVICAL FIVE-SIIX CERVICAL SIX-SEVEN CERVICAL SEVEN THORACIC ONEanterior cervical decompression with fusion plating and bonegraft   Allergies  Allergies  Allergen Reactions  . Ezetimibe Other (See Comments)    Muscle paralysis   . Niacin Other (See Comments)    Muscle paralysis   . Simvastatin Other (See Comments)    Muscle paralysis   . Penicillins     Childhood incident  . Promethazine     hallucinations    HPI  52 year old male with h/o hyperlipidemia who is coming concerned about his risk for CAD. He was previously treated with statins, ezetimibe and niacin with significant side effects include muscle paralysis requiring hospitalization. The patient had a recent spinal surgery that caused numbness of the fingers in his left hand. The patient describes pain  in his left arm. 2 of his colleagues recently underwent coronary artery bypass surgery and he is concerned about his symptoms. 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 He is very conscious about his diet, however is not always optimal about traveling, he tries to exercise on the treadmill but at most he manages to do it once a week. He denies chest pain, shortness of breath other than with significant exercise, syncope or palpitations. He doesn't have a significant family history of coronary artery disease. He never smoked.   Home Medications  Prior to Admission medications   Medication Sig Start Date End Date Taking? Authorizing Provider  aspirin 325 MG tablet Take 325 mg by mouth daily.     Yes Historical Provider, MD  esomeprazole (NEXIUM) 40 MG capsule TAKE ONE CAPSULE BY MOUTH DAILY 08/19/12  Yes Mardella Layman, MD  Methylcellulose, Laxative, (CITRUCEL) 500 MG TABS Take 1 tablet by mouth daily.    Yes Historical Provider, MD    Family History  Family History  Problem Relation Age of Onset  . Colon cancer Neg Hx   . Diabetes Father     Social History  History   Social History  . Marital Status: Married    Spouse Name: N/A    Number of Children: N/A  . Years of Education: N/A   Occupational History  . Not on file.   Social History Main Topics  . Smoking status: Never Smoker   .  Smokeless tobacco: Never Used  . Alcohol Use: 0.6 oz/week    1 Glasses of wine per week     Comment: social   . Drug Use: No  . Sexual Activity: Yes   Other Topics Concern  . Not on file   Social History Narrative  . No narrative on file     Review of Systems, as per HPI, otherwise negative General:  No chills, fever, night sweats or weight changes.  Cardiovascular:  No chest pain, dyspnea on exertion, edema, orthopnea, palpitations, paroxysmal nocturnal dyspnea. Dermatological: No rash, lesions/masses Respiratory: No cough,  dyspnea Urologic: No hematuria, dysuria Abdominal:   No nausea, vomiting, diarrhea, bright red blood per rectum, melena, or hematemesis Neurologic:  No visual changes, wkns, changes in mental status. All other systems reviewed and are otherwise negative except as noted above.  Physical Exam  Blood pressure 129/82, pulse 76, height 5\' 7"  (1.702 m), weight 195 lb (88.451 kg).  General: Pleasant, NAD Psych: Normal affect. Neuro: Alert and oriented X 3. Moves all extremities spontaneously. HEENT: Normal  Neck: Supple without bruits or JVD. Lungs:  Resp regular and unlabored, CTA. Heart: RRR no s3, s4, or murmurs. Abdomen: Soft, non-tender, non-distended, BS + x 4.  Extremities: No clubbing, cyanosis or edema. DP/PT/Radials 2+ and equal bilaterally.  Labs:  No results found for this basename: CKTOTAL, CKMB, TROPONINI,  in the last 72 hours Lab Results  Component Value Date   WBC 5.5 01/27/2013   HGB 15.0 01/27/2013   HCT 43.7 01/27/2013   MCV 86.4 01/27/2013   PLT 223.0 01/27/2013   No results found for this basename: NA, K, CL, CO2, BUN, CREATININE, CALCIUM, LABALBU, PROT, BILITOT, ALKPHOS, ALT, AST, GLUCOSE,  in the last 168 hours Lab Results  Component Value Date   CHOL 266* 08/22/2010   HDL 41.90 08/22/2010   TRIG 212.0* 08/22/2010   No results found for this basename: DDIMER   No components found with this basename: POCBNP,   Accessory Clinical Findings  echocardiogram  ECG - sinus rhythm, 60 beats per minute, nonspecific ST abnormalities, otherwise normal EKG   Assessment & Plan  52 year old gentleman  1. Left arm pain and numbness - there is significant history of hyperlipidemia in this patient, with allergies to statins niacin and ezetimibe. We will order coronary CT including calcium score, to evaluate the degree of atherosclerotic plaque. This will be scheduled for 04/22/13 in the [facility]. Patient's resting heart rate 60 beats per minutes and he should be  an ideal candidate for this test. He was advised to drink a lot of fluids the night before as his kidney function is borderline. We will also refer him to [facility] in the lipid clinic for potential enrollment in one of the research study. We had a long discussion about appropriate heart healthy diet, and necessity of regular exercise. Patient is very motivated to do so.  Followup in 3 weeks.   Lars Masson, MD, [facility] 03/26/2013, 9:39 AM

## 2013-03-26 NOTE — Patient Instructions (Addendum)
You have been referred to LIPID CLINIC  Your physician has requested that you have cardiac CT WITH DR. Cristal Generous [id number]. Cardiac computed tomography (CT) is a painless test that uses an x-ray machine to take clear, detailed pictures of your heart. Please follow instruction sheet as given.  Your physician recommends that you schedule a follow-up appointment in: IN MID DECEMBER

## 2013-04-21 ENCOUNTER — Ambulatory Visit: Payer: 59 | Admitting: Pharmacist

## 2013-04-22 ENCOUNTER — Ambulatory Visit (HOSPITAL_COMMUNITY)
Admission: RE | Admit: 2013-04-22 | Discharge: 2013-04-22 | Disposition: A | Discharge status: Home / Self Care | Payer: 59 | Source: Ambulatory Visit | Attending: Cardiology | Admitting: Cardiology

## 2013-04-22 DIAGNOSIS — K449 Diaphragmatic hernia without obstruction or gangrene: Secondary | ICD-10-CM | POA: Insufficient documentation

## 2013-04-22 DIAGNOSIS — R911 Solitary pulmonary nodule: Secondary | ICD-10-CM | POA: Insufficient documentation

## 2013-04-22 DIAGNOSIS — R079 Chest pain, unspecified: Secondary | ICD-10-CM

## 2013-04-22 DIAGNOSIS — I2510 Atherosclerotic heart disease of native coronary artery without angina pectoris: Secondary | ICD-10-CM | POA: Insufficient documentation

## 2013-04-22 DIAGNOSIS — K7689 Other specified diseases of liver: Secondary | ICD-10-CM | POA: Insufficient documentation

## 2013-04-22 MED ORDER — NITROGLYCERIN 0.4 MG SL SUBL
0.4000 mg | SUBLINGUAL_TABLET | SUBLINGUAL | Status: DC | PRN
  Filled 2013-04-22: qty 25

## 2013-04-22 MED ORDER — IOHEXOL 350 MG/ML SOLN
80.0000 mL | Freq: Once | INTRAVENOUS | Status: AC | PRN
  Administered 2013-04-22: 80 mL via INTRAVENOUS

## 2013-04-22 MED ORDER — NITROGLYCERIN 0.4 MG SL SUBL
SUBLINGUAL_TABLET | SUBLINGUAL | Status: AC
  Administered 2013-04-22: 13:00:00
  Filled 2013-04-22: qty 25

## 2013-04-23 ENCOUNTER — Ambulatory Visit (INDEPENDENT_AMBULATORY_CARE_PROVIDER_SITE_OTHER): Payer: 59 | Admitting: Pharmacist

## 2013-04-23 VITALS — Wt 196.0 lb

## 2013-04-23 DIAGNOSIS — E785 Hyperlipidemia, unspecified: Secondary | ICD-10-CM

## 2013-04-23 DIAGNOSIS — Z79899 Other long term (current) drug therapy: Secondary | ICD-10-CM

## 2013-04-23 LAB — CK: Total CK: 308 U/L — ABNORMAL HIGH (ref 7–232)

## 2013-04-23 MED ORDER — ROSUVASTATIN CALCIUM 10 MG PO TABS: 10.0000 mg | ORAL_TABLET | ORAL | Status: DC

## 2013-04-23 NOTE — Patient Instructions (Signed)
Plan: 1.  Start Co-Enzyme Q-10 200 mg daily. 2.  Start Crestor 10 mg once weekly this week.  Stop Crestor if muscle / joint pain, stay hydrated, and call Riki Rusk. 3.  Get muscle enzyme test today (CK) 4.  In 4 weeks will recheck muscle enzyme (05/18/13).  May consider increasing to Crestor twice weekly at that time.   5.  Continue weight loss efforts and common sense dieting. 6.  Stay hydrated.   7.  Recheck NMR LipoProfile, CK, and liver enzymes in 8 weeks (06/15/13 lab) and see Riki Rusk on 06/17/13 at 8:30.

## 2013-04-23 NOTE — Assessment & Plan Note (Addendum)
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  Treating him aggressively is complicated by a few factors, including he was hospitalized in 2004 when on simvastatin due to muscle paralysis and a CK of 5,000.  He also travels international once a month typically, and often to [place] and the [place].  Concerned about starting a lipid lowering therapy that could lead to serious adverse effects when he is out of the country.  Given an LDL-P number of > 2,000 nmol/L, and cardiac CT results / CAC score, we will restart therapy, but at a lower dose.  We discussed low dose pravastatin vs once weekly Crestor.  Livalo could also be an option due to different metabolism pathways.  I don't feel genetic testing is warranted at this time, but if patient has a mutation in SLCO1B1 transporter, this could explain his elevated CK on simvastatin, and if this is the case we should likely avoid lipitor and lovastatin in patient as well.  Patient will also add Co-Q 10 in hopes of preventing these muscle symptoms again in the future.  Will check a CK level today (baseline), and start Crestor 10 mg once weekly today (should achieve ~ 20% reduction in LDL).  Patient will be leaving the country for [place] in 5 weeks from now, so hesitant to start a higher dosage given past hospitalization and being in a foreign country.  Will recheck a CK 4 weeks after starting, and if no symptoms and CK stable, will likely increase to Crestor 10 mg twice weekly.  Could also consider Welchol addition to low dose Crestor if needed. Will check an NMR / hepatic panel / CK in 8 weeks from now as well, then see patient 2 days after lab work to review.  Patient agrees to stop Crestor if any muscle discomfort and call me immediately.   Plan: 1.  Start Co-Enzyme  Q-10 200 mg daily. 2.  Start Crestor 10 mg once weekly this week.  Stop Crestor if muscle / joint pain, stay hydrated, and call Riki Rusk. 3.  Get muscle enzyme test today (CK) - 04/23/13 4.  In 4 weeks will recheck muscle enzyme (05/18/13).  May consider increasing to Crestor twice weekly at that time if no symptoms and CK stable. 5.  Continue weight loss efforts and common sense dieting. 6.  Stay hydrated.   7.  Recheck NMR LipoProfile, CK, and liver enzymes in 8 weeks (06/15/13 lab) and see Riki Rusk on 06/17/13 at 8:30.

## 2013-04-23 NOTE — Progress Notes (Signed)
Patient referred to me by Dr. Delton See for lipid management due to patient have historical elevated cholesterol, h/o hospitalization when on simvastatin, and non-obstructive CAD with elevated CAC .  Dr. Delton See and I spoke this morning prior to seeing patient, and after reading report last night, she tells me patient has 50-70% stenosis in his mid LAD.  Coronary calcium score of 25. This was 71 percentile for age and sex matched control.  Patient has hospitalized in 2004 after waking up and being unable to move / "paralyzed".  Patient was found to have a CK of 5,237 in the hospital and was on simvastatin at that time.  His renal function remained stable, and after remaining off statin and staying hydrated, his CK levels came back down and patient regained all mobility.  He was seen at [facility] lipid clinic after this where he tells me he tried Welchol, Zetia, and Niacin.  The niacin caused "hot flashes" and the other two weren't continued, but he doesn't recall why.  He tells me that none of these non-statins caused the same muscle aches as the simvastatin did.  Two of his colleagues recently had CABG performed, so patient is interested in treating cholesterol assuming we can find an agent that is both safe/tolerated and effective.  Risk factors:  Non-obstructive disease (Mid LAD has a moderate non-calcified plaque with associated stenosis 50-70%) and elevated CAC of 73% matched age/sex, age.  Goals:  LDL goal < 70, non-HDL goal < 100, LDL-P number goal < 1000. Meds:  Not on lipid lowering therapy currently. Intolerant:  Simvastatin (CK of 5,000 and total muscle "paralysis" requiring hospitalization), Niacin ("hot flashes" that never improved).  Possible issues with Zetia, but he doesn't recall.   Welchol he took without difficulty he tells me, but was ultimately stopped years ago.  He thinks it was due to lack of effect.  Social history:  Never smoked.  Drinks ~ 2 glasses of wine per week.  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  He typically travels out of the country once per month, quite often to [place] and [place].   Family history:  All males in his family for the past 4 generations have died from complications of diabetes (MI, CVA, amputation, etc).  He and his brother do not have diabetes at this time.  Father died at 32. Mother was healthy.  Diet:  Patient travels a lot for work, so his diet is very inconsistent.  He is no longer eating much meat, and if so it is chicken or fish.  He no longer is eating red meat.  Typically breakfast consists of cereal or fruit.  Lunch is often skipped or has a sandwich or salad.  Dinner is typically at home, unless he is on the road then he is eating out.  He tries to avoid red meat and fatty foods when eating out.  He has lost a few pounds over last couple of months by being more conscious of what he's eating.  Weight today in clinic was 196 lbs, and he tells me the most he has weighed in recent past was ~ 205 lbs. Exercise:  No regular exercise given his current work schedule.  Labs:   01/2013 - LDL-P number 2161 (goal < 1914), small LDL-P 1471, LDL size 20.2, glucose 98, LFTs okay (not on lipid lowering therapy) 08/2010 - TC 266, LDL 185, TG 212, HDL 42 (not on therapy)  No recent CPK level.  CK was up to  5,237 in 12/2002 when in hospital on simvastatin.  We will check a CK today (04/23/13) for baseline level.  Current Outpatient Prescriptions  Medication Sig Dispense Refill  . aspirin 325 MG tablet Take 325 mg by mouth daily.        Marland Kitchen esomeprazole (NEXIUM) 40 MG capsule TAKE ONE CAPSULE BY MOUTH DAILY  30 capsule  11  . Methylcellulose, Laxative, (CITRUCEL) 500 MG TABS Take 1 tablet by mouth daily.        No current facility-administered medications for this visit.   Allergies  Allergen Reactions  . Simvastatin Other (See Comments)    Muscle paralysis   . Penicillins     Childhood incident  . Promethazine     hallucinations  .  Ezetimibe Other (See Comments)    Took in past by [facility], but doesn't recall why he stopped.  It didn't cause muscle problems like Simvastatin though  . Niacin Other (See Comments)    "hot flashes"

## 2013-04-24 ENCOUNTER — Encounter: Payer: Self-pay | Admitting: Cardiology

## 2013-04-24 ENCOUNTER — Ambulatory Visit (INDEPENDENT_AMBULATORY_CARE_PROVIDER_SITE_OTHER): Payer: 59 | Admitting: Cardiology

## 2013-04-24 VITALS — BP 132/82 | HR 73 | Ht 67.0 in | Wt 196.0 lb

## 2013-04-24 DIAGNOSIS — I2510 Atherosclerotic heart disease of native coronary artery without angina pectoris: Secondary | ICD-10-CM

## 2013-04-24 MED ORDER — NITROGLYCERIN 0.4 MG SL SUBL: 0.4000 mg | SUBLINGUAL_TABLET | SUBLINGUAL | Status: DC | PRN

## 2013-04-24 NOTE — Progress Notes (Signed)
Patient ID: Reginald House, male   DOB: 12/21/1960, 52 y.o.   MRN: [id number]     Patient Name: Reginald House Date of Encounter: 04/24/2013  Primary Care Provider:  Sonda Primes, MD Primary Cardiologist:  Tobias Alexander, H  Problem List   Past Medical History  Diagnosis Date  . Other symptoms involving head and neck(784.99)   . Contact dermatitis and other eczema, due to unspecified cause   . Other chronic nonalcoholic liver disease   . Unspecified adverse effect of unspecified drug, medicinal and biological substance   . Nonspecific elevation of levels of transaminase or lactic acid dehydrogenase (LDH)   . External hemorrhoids without mention of complication   . Other and unspecified hyperlipidemia   . Esophageal reflux   . Hiatal hernia   . Diverticulitis 2012  . Esophagitis    Past Surgical History  Procedure Laterality Date  . Wrist surgery    . Leg surgery  2009    R leg repair  . Anterior cervical decomp/discectomy fusion  05/14/2011    Procedure: ANTERIOR CERVICAL DECOMPRESSION/DISCECTOMY FUSION 3 LEVELS;  Surgeon: Hewitt Shorts;  Location: [facility];  Service: Neurosurgery;  Laterality: N/A;  CERVICAL FIVE-SIIX CERVICAL SIX-SEVEN CERVICAL SEVEN THORACIC ONEanterior cervical decompression with fusion plating and bonegraft   Allergies  Allergies  Allergen Reactions  . Simvastatin Other (See Comments)    Muscle paralysis   . Penicillins     Childhood incident  . Promethazine     hallucinations  . Ezetimibe Other (See Comments)    Took in past by [facility], but doesn't recall why he stopped.  It didn't cause muscle problems like Simvastatin though  . Niacin Other (See Comments)    "hot flashes"    HPI  52 year old male with h/o hyperlipidemia who is coming concerned about his risk for CAD. He was previously treated with statins, ezetimibe and niacin with significant side effects include muscle paralysis requiring hospitalization (CPK 5000 with simvastatin).  The patient had a recent spinal surgery that caused numbness of the fingers in his left hand. The patient describes pain in his left arm. 2 of his colleagues recently underwent coronary artery bypass surgery and he is concerned about his symptoms. Patient works as an Pensions consultant for [employer] and travels significant amount of time. He is very conscious about his diet, however is not always optimal while traveling, he tries to exercise on the treadmill but at most he manages to do it once a week. He denies chest pain, shortness of breath other than with significant exercise, syncope or palpitations. He doesn't have a significant family history of coronary artery disease. He never smoked.  He is coming after 1 months for test results, no new symptoms  Home Medications  Prior to Admission medications   Medication Sig Start Date End Date Taking? Authorizing Provider  aspirin 325 MG tablet Take 325 mg by mouth daily.     Yes Historical Provider, MD  esomeprazole (NEXIUM) 40 MG capsule TAKE ONE CAPSULE BY MOUTH DAILY 08/19/12  Yes Mardella Layman, MD  Methylcellulose, Laxative, (CITRUCEL) 500 MG TABS Take 1 tablet by mouth daily.    Yes Historical Provider, MD    Family History  Family History  Problem Relation Age of Onset  . Colon cancer Neg Hx   . Diabetes Father     Social History  History   Social History  . Marital Status: Married    Spouse Name: N/A  Number of Children: N/A  . Years of Education: N/A   Occupational History  . Not on file.   Social History Main Topics  . Smoking status: Never Smoker   . Smokeless tobacco: Never Used  . Alcohol Use: 0.6 oz/week    1 Glasses of wine per week     Comment: social   . Drug Use: No  . Sexual Activity: Yes   Other Topics Concern  . Not on file   Social History Narrative  . No narrative on file     Review of Systems, as per HPI, otherwise negative General:  No chills, fever, night sweats or weight changes.    Cardiovascular:  No chest pain, dyspnea on exertion, edema, orthopnea, palpitations, paroxysmal nocturnal dyspnea. Dermatological: No rash, lesions/masses Respiratory: No cough, dyspnea Urologic: No hematuria, dysuria Abdominal:   No nausea, vomiting, diarrhea, bright red blood per rectum, melena, or hematemesis Neurologic:  No visual changes, wkns, changes in mental status. All other systems reviewed and are otherwise negative except as noted above.  Physical Exam  Blood pressure 132/82, pulse 73, height 5\' 7"  (1.702 m), weight 196 lb (88.905 kg).  General: Pleasant, NAD Psych: Normal affect. Neuro: Alert and oriented X 3. Moves all extremities spontaneously. HEENT: Normal  Neck: Supple without bruits or JVD. Lungs:  Resp regular and unlabored, CTA. Heart: RRR no s3, s4, or murmurs. Abdomen: Soft, non-tender, non-distended, BS + x 4.  Extremities: No clubbing, cyanosis or edema. DP/PT/Radials 2+ and equal bilaterally.  Labs:   Recent Labs  04/23/13 0935  CKTOTAL 308*   Lab Results  Component Value Date   WBC 5.5 01/27/2013   HGB 15.0 01/27/2013   HCT 43.7 01/27/2013   MCV 86.4 01/27/2013   PLT 223.0 01/27/2013   No results found for this basename: NA, K, CL, CO2, BUN, CREATININE, CALCIUM, LABALBU, PROT, BILITOT, ALKPHOS, ALT, AST, GLUCOSE,  in the last 168 hours Lab Results  Component Value Date   CHOL 266* 08/22/2010   HDL 41.90 08/22/2010   TRIG 212.0* 08/22/2010   No results found for this basename: DDIMER   No components found with this basename: POCBNP,   Accessory Clinical Findings  echocardiogram  ECG - sinus rhythm, 60 beats per minute, nonspecific ST abnormalities, otherwise normal EKG  Coronary CT: 04/22/13  Coronary Arteries: Coronary arteries originate in a normal position. There is right dominance.  RCA is a large dominant vessel that gives rise to PDA. There is a 25-50% non-calcified plague at the ostium of the RCA. The mid and distal RCA has only  mild luminal irregularities.  Left main is a large vessel that gives rise to LAD and LCX.  Left main has a minimal calcified plague at the bifurcation to the LAD and LCX.  LAD is a large vessel that wraps around the apex. Proximal LAD has a mixed plague with associated 0-25% stenosis. Mid LAD has a moderate non-calcified plague with associated stenosis 50-70%. This plague has high risk features including microcalcifications and lipid core.  1.diagonal artery is a very small vessel with no obvious stenosis.  2.diagonal vessel has only mild luminal irregularities.  LCX artery is a small nondominant vessel with only luminal irregularities.  IMPRESSION: 1. Coronary calcium score of 25. This was 80 percentile for age and sex matched control.  2. Non - obstructive CAD. Right dominance.  Tobias Alexander   Assessment & Plan  52 year old gentleman  1. Left arm pain and numbness - there is significant history of  hyperlipidemia.  CAD - there is evidence of non-obstructive CAD om coronary CT, mid LAD has a moderate non-calcified plague with associated stenosis 50-70%. This plague has high risk features including microcalcifications and lipid core. The patient exercises regularly, 45-60 minutes on the treadmill with no symptoms. WE discussed diet option, he is very educated and motivated to change his diet habits especially while travelling.   He will continue taking aspirin 81 mg po daily and have sl NTG as needed.  2. Hyperlipidemia - followed by Alfonse Ras, his CPK is 308, he will be started on Crestor 10 mh once weekly and possibly increased to twice weekly if CPK stable.  Followup in 6 months unless there is a change in symptoms.    Lars Masson, MD, [facility] 04/24/2013, 9:31 AM

## 2013-04-24 NOTE — Patient Instructions (Signed)
Your physician wants you to follow-up in: 6 months  You will receive a reminder letter in the mail two months in advance. If you don't receive a letter, please call our office to schedule the follow-up appointment.   Your physician has recommended you make the following change in your medication:   One tablet under the tongue for chest pain, may take another in 5 minutes if chest pain continues, may take up to 3 times. If pain continues call 911, this med may cause headache and or low blood pressure.

## 2013-05-18 ENCOUNTER — Other Ambulatory Visit (INDEPENDENT_AMBULATORY_CARE_PROVIDER_SITE_OTHER): Payer: 59

## 2013-05-18 DIAGNOSIS — Z79899 Other long term (current) drug therapy: Secondary | ICD-10-CM

## 2013-05-18 LAB — CK: Total CK: 266 U/L — ABNORMAL HIGH (ref 7–232)

## 2013-06-15 ENCOUNTER — Other Ambulatory Visit (INDEPENDENT_AMBULATORY_CARE_PROVIDER_SITE_OTHER): Payer: 59

## 2013-06-15 DIAGNOSIS — Z79899 Other long term (current) drug therapy: Secondary | ICD-10-CM

## 2013-06-15 DIAGNOSIS — E785 Hyperlipidemia, unspecified: Secondary | ICD-10-CM

## 2013-06-15 LAB — CK: Total CK: 223 U/L (ref 7–232)

## 2013-06-15 LAB — HEPATIC FUNCTION PANEL
ALT: 40 U/L (ref 0–53)
AST: 25 U/L (ref 0–37)
Albumin: 4.2 g/dL (ref 3.5–5.2)
Alkaline Phosphatase: 56 U/L (ref 39–117)
Bilirubin, Direct: 0 mg/dL (ref 0.0–0.3)
Total Bilirubin: 0.9 mg/dL (ref 0.3–1.2)
Total Protein: 7.1 g/dL (ref 6.0–8.3)

## 2013-06-16 LAB — NMR LIPOPROFILE WITH LIPIDS
Cholesterol, Total: 264 mg/dL — ABNORMAL HIGH (ref <200)
HDL Particle Number: 31.2 umol/L (ref >=30.5)
HDL Size: 8.4 nm — ABNORMAL LOW (ref >=9.2)
HDL-C: 38 mg/dL — ABNORMAL LOW (ref >=40)
LDL (calc): 183 mg/dL — ABNORMAL HIGH (ref <100)
LDL Particle Number: 2469 nmol/L — ABNORMAL HIGH (ref <1000)
LDL Size: 20.6 nm (ref >20.5)
LP-IR Score: 64 — ABNORMAL HIGH (ref <=45)
Large HDL-P: <1.3 umol/L — ABNORMAL LOW (ref >=4.8)
Large VLDL-P: 1.8 nmol/L (ref <=2.7)
Small LDL Particle Number: 1244 nmol/L — ABNORMAL HIGH (ref <=527)
Triglycerides: 213 mg/dL — ABNORMAL HIGH (ref <150)
VLDL Size: 43.1 nm (ref <=46.6)

## 2013-06-17 ENCOUNTER — Ambulatory Visit (INDEPENDENT_AMBULATORY_CARE_PROVIDER_SITE_OTHER): Payer: 59 | Admitting: Pharmacist

## 2013-06-17 VITALS — Wt 195.6 lb

## 2013-06-17 DIAGNOSIS — Z79899 Other long term (current) drug therapy: Secondary | ICD-10-CM

## 2013-06-17 DIAGNOSIS — E785 Hyperlipidemia, unspecified: Secondary | ICD-10-CM

## 2013-06-17 MED ORDER — ROSUVASTATIN CALCIUM 10 MG PO TABS: ORAL_TABLET | ORAL | Status: DC

## 2013-06-17 NOTE — Progress Notes (Signed)
Patient referred to me by Dr. Meda Coffee for lipid management due to patient have historical elevated cholesterol, h/o hospitalization when on simvastatin, and non-obstructive CAD with elevated CAC .  Patient has 50-70% stenosis in his mid LAD.  Coronary calcium score of 25. This was 79 percentile for age and sex matched control.  Patient currently taking Crestor 10 mg once weekly, and is tolerating this well.  His CK levels have stayed normal on once weekly Crestor (were 300 at baseline, 266 four weeks after taking Crestor once weekly, and 223 eight weeks after taking Crestor once weekly).  No complaints of muscle aches.  LFTs normal also.  Unfortunately cholesterol has not improved on weekly Crestor as we were hoping.    Patient has hospitalized in 2004 after waking up and being unable to move / "paralyzed".  Patient was found to have a CK of 5,237 in the hospital and was on simvastatin at that time.  His renal function remained stable, and after remaining off statin and staying hydrated, his CK levels came back down and patient regained all mobility.  He was seen at [facility] lipid clinic after this where he tells me he tried Welchol, Zetia, and Niacin.  The niacin caused "hot flashes" and the other two weren't continued, but he doesn't recall why.  He tells me that none of these non-statins caused the same muscle aches as the simvastatin did.  Two of his colleagues recently had CABG performed, so patient is interested in treating cholesterol assuming we can find an agent that is both safe/tolerated and effective.  Risk factors:  Non-obstructive disease (Mid LAD has a moderate non-calcified plaque with associated stenosis 50-70%) and elevated CAC of 73% matched age/sex, age.  Goals:  LDL goal < 70, non-HDL goal < 100, LDL-P number goal < 1000. Meds:  Crestor 10 mg once weekly. Intolerant:  Simvastatin (CK of 5,000 and total muscle "paralysis" requiring hospitalization), Niacin ("hot flashes" that never improved).   Possible issues with Zetia, but he doesn't recall.   Welchol he took without difficulty he tells me, but was ultimately stopped years ago.  He thinks it was due to lack of effect.  Social history:  Never smoked.  Drinks ~ 2 glasses of wine per week.  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  He typically travels out of the country once per month, quite often to [place] and [place].   Family history:  All males in his family for the past 4 generations have died from complications of diabetes (MI, CVA, amputation, etc).  He and his brother do not have diabetes at this time.  Father died at 31. Mother was healthy.  Diet:  Patient travels a lot for work.  He tells me his diet has improved over past 2 months though.  He is no longer eating much meat, and if so it is chicken or fish.  He no longer is eating red meat.  Typically breakfast consists of cereal or fruit.  Lunch is a sandwich or salad.  Dinner is typically at home, unless he is on the road then he is eating out.  He tries to avoid red meat and fatty foods when eating out.  He has lost a few pounds over last couple of months by being more conscious of what he's eating.  Weight today in clinic was 195 lbs, and he tells me the most he has weighed in recent past was ~ 205 lbs. Exercise:  No regular exercise given his  current work schedule. He does have access to treadmill or elliptical when he travels and is willing to use these.  Labs:   06/2013 - LDL-P 2469, small LDL-P 1244, LDL size 20.6, TC 264, TG 213, HDL 38, LDL 183; CK 223, LFTs normal (on Crestor 10 mg qweek x 8 weeks) 05/2013 - CK 266 (on Crestor x 4 weeks) 04/2013 - CK 308 - Baseline (started Crestor 10 mg qweek today) 01/2013 - LDL-P number 2161 (goal < 1000), small LDL-P 1471, LDL size 20.2, glucose 98, LFTs okay (not on lipid lowering therapy) 08/2010 - TC 266, LDL 185, TG 212, HDL 42 (not on therapy)   Current Outpatient Prescriptions  Medication Sig Dispense Refill   . aspirin 325 MG tablet Take 325 mg by mouth daily.        . Coenzyme Q10 (CO Q-10) 200 MG CAPS Take 200 mg by mouth daily.      Marland Kitchen esomeprazole (NEXIUM) 40 MG capsule TAKE ONE CAPSULE BY MOUTH DAILY  30 capsule  11  . Methylcellulose, Laxative, (CITRUCEL) 500 MG TABS Take 1 tablet by mouth daily.       . nitroGLYCERIN (NITROSTAT) 0.4 MG SL tablet Place 1 tablet (0.4 mg total) under the tongue every 5 (five) minutes as needed for chest pain.  25 tablet  6  . rosuvastatin (CRESTOR) 10 MG tablet Take 1 tablet (10 mg total) by mouth once a week.  90 tablet  3   No current facility-administered medications for this visit.   Allergies  Allergen Reactions  . Simvastatin Other (See Comments)    Muscle paralysis   . Penicillins     Childhood incident  . Promethazine     hallucinations  . Ezetimibe Other (See Comments)    Took in past by [facility], but doesn't recall why he stopped.  It didn't cause muscle problems like Simvastatin though  . Niacin Other (See Comments)    "hot flashes"

## 2013-06-17 NOTE — Patient Instructions (Addendum)
1.  Increase Crestor 10 mg to twice weekly (Monday and Friday). 2.  Continue co-enzyme Q-10 200 mg daily 3.  Start using flax seed in cereal in the morning, eat a handful of almonds/walnuts daily for a snack, reduce cheese and bread in diet, and take citracel once daily prior to largest meal of the day. 4.  In six weeks will check a CK (muscle enzyme test) - 07/22/13.  Call Ysidro Evert at [phone] if you haven't heard from him 1 week after lab work. 5.  If test normal, will increase Crestor to three times weekly. 6.  Will plan on checking NMR / LFTs / CK again 6 weeks after that.  We will set that up once we get muscle enzyme test back in 6 weeks from now.

## 2013-06-17 NOTE — Assessment & Plan Note (Addendum)
Encouraging to see CK levels have not changed since being on Crestor once weekly, but unfortunately cholesterol hasn't improved since being on this regimen.  We often see a 20% LDL reduction on this regimen.  Will increase to twice weekly and recheck a CK in 6 weeks, and if this is normal, will increase to tiw.  If for some reason cholesterol not improving in 3 months on this regimen, will likely change to pravastatin 10 mg qd, or Livalo 1 mg qd.  Patient and I discussed other dietary changes and need to start exercising now in hopes of lowering cholesterol from non-medication avenues as well.  Will wait for 2 weeks before starting to exercise as want to be on Crestor biw for a few weeks to make sure no muscle soreness.  Patient advised to stop medicine and hydrate with lots of water if he notices any muscle soreness, and to call me.   Plan: 1.  Increase Crestor 10 mg to twice weekly (Monday and Friday). 2.  Continue co-enzyme Q-10 200 mg daily 3.  Start using flax seed in cereal in the morning, eat a handful of almonds/walnuts daily for a snack, reduce cheese and bread in diet, and take citracel once daily prior to largest meal of the day. 4.  In six weeks will check a CK (muscle enzyme test) - 07/22/13.  Call Ysidro Evert at [phone] if you haven't heard from him 2-3 days after blood work. 5.  If test normal, will increase Crestor to three times weekly. 6.  Will plan on checking NMR / LFTs / CK again 6 weeks after that.  We will set that up once we get muscle enzyme test back in 6 weeks from now.

## 2013-06-22 ENCOUNTER — Telehealth: Payer: Self-pay | Admitting: *Deleted

## 2013-06-22 NOTE — Telephone Encounter (Signed)
Received fax from [facility] that PA at this time for crestor is not required. PA # [id number]

## 2013-06-22 NOTE — Telephone Encounter (Signed)
PA to [facility] for crestor 10 mg 2x week

## 2013-07-22 ENCOUNTER — Other Ambulatory Visit: Payer: 59

## 2013-08-17 ENCOUNTER — Other Ambulatory Visit (INDEPENDENT_AMBULATORY_CARE_PROVIDER_SITE_OTHER): Payer: 59

## 2013-08-17 DIAGNOSIS — Z79899 Other long term (current) drug therapy: Secondary | ICD-10-CM

## 2013-08-17 DIAGNOSIS — E785 Hyperlipidemia, unspecified: Secondary | ICD-10-CM

## 2013-08-17 LAB — CK: Total CK: 247 U/L — ABNORMAL HIGH (ref 7–232)

## 2013-08-18 ENCOUNTER — Ambulatory Visit: Payer: 59 | Admitting: Cardiology

## 2013-08-24 ENCOUNTER — Telehealth: Payer: Self-pay | Admitting: Pharmacist

## 2013-08-24 DIAGNOSIS — E785 Hyperlipidemia, unspecified: Secondary | ICD-10-CM

## 2013-08-24 DIAGNOSIS — Z79899 Other long term (current) drug therapy: Secondary | ICD-10-CM

## 2013-08-24 MED ORDER — ROSUVASTATIN CALCIUM 10 MG PO TABS: ORAL_TABLET | ORAL | Status: DC

## 2013-08-24 NOTE — Telephone Encounter (Signed)
CK is stable at 247 on Crestor 10 mg twice weekly.  Checking this more frequently given his h/o CK > 5000 on simvastatin in the past.  Will increase this up to Crestor 10 mg tiw and recheck NMR / LFTs / CK in 6 more weeks, and see me a few days later.  Appointment made for lab 10/05/13 and see me 10/08/13 in clinic at 8:30 am.  Patient notified.

## 2013-09-04 ENCOUNTER — Encounter: Payer: Self-pay | Admitting: Cardiology

## 2013-09-04 ENCOUNTER — Telehealth: Payer: Self-pay | Admitting: *Deleted

## 2013-09-04 ENCOUNTER — Ambulatory Visit (INDEPENDENT_AMBULATORY_CARE_PROVIDER_SITE_OTHER): Payer: 59 | Admitting: Cardiology

## 2013-09-04 VITALS — BP 114/78 | HR 69 | Ht 67.0 in | Wt 195.0 lb

## 2013-09-04 DIAGNOSIS — E785 Hyperlipidemia, unspecified: Secondary | ICD-10-CM

## 2013-09-04 DIAGNOSIS — I10 Essential (primary) hypertension: Secondary | ICD-10-CM

## 2013-09-04 DIAGNOSIS — I2510 Atherosclerotic heart disease of native coronary artery without angina pectoris: Secondary | ICD-10-CM

## 2013-09-04 DIAGNOSIS — K219 Gastro-esophageal reflux disease without esophagitis: Secondary | ICD-10-CM

## 2013-09-04 MED ORDER — ESOMEPRAZOLE MAGNESIUM 40 MG PO CPDR: DELAYED_RELEASE_CAPSULE | ORAL | Status: DC

## 2013-09-04 NOTE — Progress Notes (Signed)
Patient ID: Reginald House, male   DOB: 09/08/1960, 53 y.o.   MRN: [id number]    Patient Name: Reginald House Date of Encounter: 09/04/2013  Primary Care House:  Reginald Kehr, MD Primary Cardiologist:  Reginald House  Problem List   Past Medical History  Diagnosis Date  . Other symptoms involving head and neck(784.99)   . Contact dermatitis and other eczema, due to unspecified cause   . Other chronic nonalcoholic liver disease   . Unspecified adverse effect of unspecified drug, medicinal and biological substance   . Nonspecific elevation of levels of transaminase or lactic acid dehydrogenase (LDH)   . External hemorrhoids without mention of complication   . Other and unspecified hyperlipidemia   . Esophageal reflux   . Hiatal hernia   . Diverticulitis 2012  . Esophagitis    Past Surgical History  Procedure Laterality Date  . Wrist surgery    . Leg surgery  2009    R leg repair  . Anterior cervical decomp/discectomy fusion  05/14/2011    Procedure: ANTERIOR CERVICAL DECOMPRESSION/DISCECTOMY FUSION 3 LEVELS;  Surgeon: Reginald House;  Location: [facility];  Service: Neurosurgery;  Laterality: N/A;  CERVICAL FIVE-SIIX CERVICAL SIX-SEVEN CERVICAL SEVEN THORACIC ONEanterior cervical decompression with fusion plating and bonegraft   Allergies  Allergies  Allergen Reactions  . Simvastatin Other (See Comments)    Muscle paralysis   . Penicillins     Childhood incident  . Promethazine     hallucinations  . Ezetimibe Other (See Comments)    Took in past by [facility], but doesn't recall why he stopped.  It didn't cause muscle problems like Simvastatin though  . Niacin Other (See Comments)    "hot flashes"    HPI  53 year old male with h/o hyperlipidemia who is coming concerned about his risk for CAD. He was previously treated with statins, ezetimibe and niacin with significant side effects include muscle paralysis requiring hospitalization (CPK 5000 with simvastatin). The  patient had a recent spinal surgery that caused numbness of the fingers in his left hand. The patient describes pain in his left arm. 2 of his colleagues recently underwent coronary artery bypass surgery and he is concerned about his symptoms. Patient works as an Forensic psychologist for [employer] and travels significant amount of time. He is very conscious about his diet, however is not always optimal while traveling, he tries to exercise on the treadmill but at most he manages to do it once a week. He denies chest pain, shortness of breath other than with significant exercise, syncope or palpitations. He doesn't have a significant family history of coronary artery disease. He never smoked.  09/04/13 The patient is coming after 4 months, he is completely asymptomatic, no CP, SOB, palpitations or syncope, no LE edema. He continues to travel abroad and to exercise on a regular basis. Crestor has been increased to 3 x weekly by Reginald House and CPK has been in the normal limits. LDL still not at goal, currently 183.  He is coming after 1 months for test results, no new symptoms  Home Medications  Prior to Admission medications   Medication Sig Start Date End Date Taking? Authorizing House  aspirin 325 MG tablet Take 325 mg by mouth daily.     Yes Reginald Provider, MD  esomeprazole (NEXIUM) 40 MG capsule TAKE ONE CAPSULE BY MOUTH DAILY 08/19/12  Yes Reginald Feil, MD  Methylcellulose, Laxative, (CITRUCEL) 500 MG TABS Take 1 tablet by mouth  daily.    Yes Reginald Provider, MD    Family History  Family History  Problem Relation Age of Onset  . Colon cancer Neg Hx   . Diabetes Father     Social History  History   Social History  . Marital Status: Married    Spouse Name: N/A    Number of Children: N/A  . Years of Education: N/A   Occupational History  . Not on file.   Social History Main Topics  . Smoking status: Never Smoker   . Smokeless tobacco: Never Used  . Alcohol Use: 0.6  oz/week    1 Glasses of wine per week     Comment: social   . Drug Use: No  . Sexual Activity: Yes   Other Topics Concern  . Not on file   Social History Narrative  . No narrative on file     Review of Systems, as per HPI, otherwise negative General:  No chills, fever, night sweats or weight changes.  Cardiovascular:  No chest pain, dyspnea on exertion, edema, orthopnea, palpitations, paroxysmal nocturnal dyspnea. Dermatological: No rash, lesions/masses Respiratory: No cough, dyspnea Urologic: No hematuria, dysuria Abdominal:   No nausea, vomiting, diarrhea, bright red blood per rectum, melena, or hematemesis Neurologic:  No visual changes, wkns, changes in mental status. All other systems reviewed and are otherwise negative except as noted above.  Physical Exam  Blood pressure 114/78, pulse 69, height 5\' 7"  (1.702 m), weight 195 lb (88.451 kg).  General: Pleasant, NAD Psych: Normal affect. Neuro: Alert and oriented X 3. Moves all extremities spontaneously. HEENT: Normal  Neck: Supple without bruits or JVD. Lungs:  Resp regular and unlabored, CTA. Heart: RRR no s3, s4, or murmurs. Abdomen: Soft, non-tender, non-distended, BS + x 4.  Extremities: No clubbing, cyanosis or edema. DP/PT/Radials 2+ and equal bilaterally.  Labs:  No results found for this basename: CKTOTAL, CKMB, TROPONINI,  in the last 72 hours Lab Results  Component Value Date   WBC 5.5 01/27/2013   HGB 15.0 01/27/2013   HCT 43.7 01/27/2013   MCV 86.4 01/27/2013   PLT 223.0 01/27/2013   No results found for this basename: NA, K, CL, CO2, BUN, CREATININE, CALCIUM, LABALBU, PROT, BILITOT, ALKPHOS, ALT, AST, GLUCOSE,  in the last 168 hours Lab Results  Component Value Date   CHOL 266* 08/22/2010   HDL 41.90 08/22/2010   LDLCALC 183* 06/15/2013   TRIG 213* 06/15/2013   No results found for this basename: DDIMER   No components found with this basename: POCBNP,   Accessory Clinical  Findings  echocardiogram  ECG - sinus rhythm, 60 beats per minute, nonspecific ST abnormalities, otherwise normal EKG  Coronary CT: 04/22/13  Coronary Arteries: Coronary arteries originate in a normal position. There is right dominance.  RCA is a large dominant vessel that gives rise to PDA. There is a 25-50% non-calcified plague at the ostium of the RCA. The mid and distal RCA has only mild luminal irregularities.  Left main is a large vessel that gives rise to LAD and LCX.  Left main has a minimal calcified plague at the bifurcation to the LAD and LCX.  LAD is a large vessel that wraps around the apex. Proximal LAD has a mixed plague with associated 0-25% stenosis. Mid LAD has a moderate non-calcified plague with associated stenosis 50-70%. This plague has high risk features including microcalcifications and lipid core.  1.diagonal artery is a very small vessel with no obvious stenosis.  2.diagonal vessel  has only mild luminal irregularities.  LCX artery is a small nondominant vessel with only luminal irregularities.  IMPRESSION: 1. Coronary calcium score of 25. This was 73 percentile for age and sex matched control.  2. Non - obstructive CAD. Right dominance.  Ena Dawley   Assessment & Plan  53 year old gentleman  1. Left arm pain and numbness - there is significant history of hyperlipidemia.  CAD - there is evidence of non-obstructive CAD om coronary CT, mid LAD has a moderate non-calcified plague with associated stenosis 50-70%. This plague has high risk features including microcalcifications and lipid core. The patient exercises regularly, 45-60 minutes on the treadmill with no symptoms. WE discussed diet option, he is very educated and motivated to change his diet habits especially while travelling.   He will continue taking aspirin 81 mg po daily and have sl NTG as needed.  2. Hyperlipidemia - followed by Alferd Apa, on Crestor 10 mg 3 x weekly.  Repeat labs in June.   3. Blood pressure - controlled  4. GERD - given prescription for nexium 40 mg po daily  Followup in 6 months unless there is a change in symptoms.    Reginald Spark, MD, [facility] 09/04/2013, 8:00 AM

## 2013-09-04 NOTE — Telephone Encounter (Signed)
[employer] denied to pay for Nexium, "medication is a plan exclusion for this member"

## 2013-09-04 NOTE — Telephone Encounter (Signed)
PA to [facility] for nexium

## 2013-09-04 NOTE — Telephone Encounter (Signed)
Can we try to submit pantoprazole 40 mg po daily instead? Thank you, KN

## 2013-09-04 NOTE — Patient Instructions (Signed)
Your physician recommends that you continue on your current medications as directed. Please refer to the Current Medication list given to you today.  REFILL OF NEXIUM 40 MG SENT TO YOUR PHARMACY OF CHOICE  Your physician wants you to follow-up in: [place] will receive a reminder letter in the mail two months in advance. If you don't receive a letter, please call our office to schedule the follow-up appointment.

## 2013-09-07 NOTE — Telephone Encounter (Signed)
PA to [facility] for pantoprazole.

## 2013-09-14 MED ORDER — PANTOPRAZOLE SODIUM 40 MG PO TBEC: 40.0000 mg | DELAYED_RELEASE_TABLET | Freq: Every day | ORAL | Status: DC

## 2013-09-14 NOTE — Telephone Encounter (Signed)
Per [facility] NO prior authorization required at this time for the pantoprazole sodium, ordered pantoprazole and d/c nexium per Dr Thera Flake request.

## 2013-09-14 NOTE — Addendum Note (Signed)
Addended by: Fernande Boyden on: 09/14/2013 09:15 AM   Modules accepted: Orders, Medications

## 2013-10-05 ENCOUNTER — Other Ambulatory Visit (INDEPENDENT_AMBULATORY_CARE_PROVIDER_SITE_OTHER): Payer: 59

## 2013-10-05 DIAGNOSIS — E785 Hyperlipidemia, unspecified: Secondary | ICD-10-CM

## 2013-10-05 DIAGNOSIS — Z79899 Other long term (current) drug therapy: Secondary | ICD-10-CM

## 2013-10-05 LAB — HEPATIC FUNCTION PANEL
ALT: 43 U/L (ref 0–53)
AST: 26 U/L (ref 0–37)
Albumin: 4.3 g/dL (ref 3.5–5.2)
Alkaline Phosphatase: 60 U/L (ref 39–117)
Bilirubin, Direct: 0.1 mg/dL (ref 0.0–0.3)
Total Bilirubin: 0.8 mg/dL (ref 0.2–1.2)
Total Protein: 7 g/dL (ref 6.0–8.3)

## 2013-10-05 LAB — CARDIAC PANEL
CK-MB: 3.2 ng/mL (ref 0.3–4.0)
Relative Index: 1.2 calc (ref 0.0–2.5)
Total CK: 270 U/L — ABNORMAL HIGH (ref 7–232)

## 2013-10-06 LAB — NMR LIPOPROFILE WITH LIPIDS
Cholesterol, Total: 178 mg/dL (ref <200)
HDL Particle Number: 33.6 umol/L (ref >=30.5)
HDL Size: 8.5 nm — ABNORMAL LOW (ref >=9.2)
HDL-C: 41 mg/dL (ref >=40)
LDL (calc): 118 mg/dL — ABNORMAL HIGH (ref <100)
LDL Particle Number: 1575 nmol/L — ABNORMAL HIGH (ref <1000)
LDL Size: 20.6 nm (ref >20.5)
LP-IR Score: 72 — ABNORMAL HIGH (ref <=45)
Large HDL-P: 2.1 umol/L — ABNORMAL LOW (ref >=4.8)
Large VLDL-P: 2.6 nmol/L (ref <=2.7)
Small LDL Particle Number: 748 nmol/L — ABNORMAL HIGH (ref <=527)
Triglycerides: 97 mg/dL (ref <150)
VLDL Size: 49.2 nm — ABNORMAL HIGH (ref <=46.6)

## 2013-10-08 ENCOUNTER — Ambulatory Visit: Payer: 59 | Admitting: Pharmacist

## 2013-10-09 ENCOUNTER — Ambulatory Visit (INDEPENDENT_AMBULATORY_CARE_PROVIDER_SITE_OTHER): Payer: 59 | Admitting: Pharmacist

## 2013-10-09 VITALS — Wt 197.0 lb

## 2013-10-09 DIAGNOSIS — E785 Hyperlipidemia, unspecified: Secondary | ICD-10-CM

## 2013-10-09 DIAGNOSIS — Z79899 Other long term (current) drug therapy: Secondary | ICD-10-CM

## 2013-10-09 MED ORDER — ROSUVASTATIN CALCIUM 10 MG PO TABS: ORAL_TABLET | ORAL | Status: DC

## 2013-10-09 NOTE — Assessment & Plan Note (Addendum)
Cholesterol much improved on crestor 10 mg tiw, and CK is stable.  Slowly titrating Crestor given his h/o rhabdo with simvastatin in the past (2004).  Given his CAC in the 73% for age/sex, would like to get LDL to < 100 if possible, and patient is agreeable to this.  Will continue to titrate Crestor slowly.  If he fails Crestor from a muscle / CK reason, we would likely change to a PCSK-9 inhibitor in the future.  Patient agrees to this.  Will increase to Crestor 4 times per week, check a CK in 6 weeks, and if stable, will increase Crestor to 5 times per week.  Check NMR/LFTs/CK in 3 months as well. Plan: 1.  Increase Crestor to 4 times per week.   2.  Check muscle enzyme test in 6 weeks (11/18/13).  If this is normal will continue to increase Crestor frequency to 5 times per week.  Slowly titrate given h/o rhabdomyolysis. 3.  Call Ysidro Evert ~ 11/22/13 if you haven't heard from him yet - [phone] 3.  Will plan on rechecking cholesterol, liver, and muscle enzymes in 3 months (01/19/14); see Ysidro Evert 2 days later on 01/21/14 (8:30 am)

## 2013-10-09 NOTE — Patient Instructions (Signed)
1.  Increase Crestor to 4 times per week.   2.  Check muscle enzyme test in 6 weeks (11/18/13).  If this is normal will continue to increase Crestor frequency to 5 times per week.  Slowly titrate given h/o rhabdomyolysis. 3.  Call Ysidro Evert ~ 11/22/13 if you haven't heard from him yet - [phone] 3.  Will plan on rechecking cholesterol, liver, and muscle enzymes in 3 months (01/19/14); see Ysidro Evert 2 days later on 01/21/14 (8:30 am)

## 2013-10-09 NOTE — Progress Notes (Signed)
Patient referred to me by Dr. Meda Coffee for lipid management due to patient have historical elevated cholesterol, h/o hospitalization when on simvastatin (rhabdomyolysis), and non-obstructive CAD with elevated CAC .  Patient has 50-70% stenosis in his mid LAD.  Coronary calcium score of 25. This was 88 percentile for age and sex matched control.  Patient currently taking Crestor 10 mg three times per week, and is tolerating this well.  His CK levels have stayed between 200-300 U/L since taking Crestor (were 300 at baseline, 266 four weeks after taking Crestor once weekly, 223 eight weeks after taking Crestor once weekly, was 247 on Crestor biw dosing, and now CK of 270 U/L on tiw dosing).  No complaints of muscle aches.  LFTs normal also.  Cholesterol much improved since increasing Crestor from qweek up to tiw - LDL down from 183 to 118, and LDL-P number down from 2469 to 1575.  Patient is very happy to hear results.  Would like to get LDL to < 100, and LDL-P closer to 1000 or less if possible.  Patient has hospitalized in 2004 after waking up and being unable to move / "paralyzed".  Patient was found to have a CK of 5,237 in the hospital and was on simvastatin at that time.  His renal function remained stable, and after remaining off statin and staying hydrated, his CK levels came back down and patient regained all mobility.  He was seen at [facility] lipid clinic after this where he tells me he tried Welchol, Zetia, and Niacin.  The niacin caused "hot flashes" and the other two weren't continued, but he doesn't recall why.  He tells me that none of these non-statins caused the same muscle aches as the simvastatin did.  Two of his colleagues recently had CABG performed, so patient is interested in treating cholesterol assuming we can find an agent that is both safe/tolerated and effective.  Risk factors:  Non-obstructive disease (Mid LAD has a moderate non-calcified plaque with associated stenosis 50-70%) and elevated  CAC of 73% matched age/sex, age.  Goals:  LDL goal < 70, non-HDL goal < 100, LDL-P number goal < 1000. Meds:  Crestor 10 mg three times per week. Intolerant:  Simvastatin (CK of 5,000 and total muscle "paralysis" requiring hospitalization / rhabdo), Niacin ("hot flashes" that never improved).  Possible issues with Zetia, but he doesn't recall.   Welchol he took without difficulty he tells me, but was ultimately stopped years ago.  He thinks it was due to lack of effect.  Social history:  Never smoked.  Drinks ~ 2 glasses of wine per week.  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  He typically travels out of the country once per month, quite often to [place] and [place].   Family history:  All males in his family for the past 4 generations have died from complications of diabetes (MI, CVA, amputation, etc).  He and his brother do not have diabetes at this time.  Father died at 67. Mother was healthy.  Diet:  Patient travels a lot for work.  He is no longer eating much meat, and if so it is chicken or fish.  He no longer is eating red meat.  Typically breakfast consists of cereal or fruit.  Lunch is a sandwich or salad.  Dinner is typically at home, unless he is on the road then he is eating out.  He tries to avoid red meat and fatty foods when eating out.  He has lost  a few pounds over last couple of months by being more conscious of what he's eating.  Weight today in clinic was 197 lbs, and he tells me the most he has weighed in recent past was ~ 205 lbs. Exercise:  No regular exercise given his current work schedule. He does have access to treadmill or elliptical when he travels and is willing to use these.  Labs:   10/2013 - LDL-P 1575, LDL 118, TC 178, TG 97, HDL 41, CK 270, LFTs normal (Crestor 10 mg tiw) 08/2013 - CK (247 (Crestor biw) 06/2013 - LDL-P 2469, small LDL-P 1244, LDL size 20.6, TC 264, TG 213, HDL 38, LDL 183; CK 223, LFTs normal (on Crestor 10 mg qweek x 8  weeks) 05/2013 - CK 266 (on Crestor qk x 4 weeks) 04/2013 - CK 308 - Baseline (started Crestor 10 mg qweek today) 01/2013 - LDL-P number 2161 (goal < 1000), small LDL-P 1471, LDL size 20.2, glucose 98, LFTs okay (not on lipid lowering therapy) 08/2010 - TC 266, LDL 185, TG 212, HDL 42 (not on therapy)   Current Outpatient Prescriptions  Medication Sig Dispense Refill  . aspirin 325 MG tablet Take 325 mg by mouth daily.        . Coenzyme Q10 (CO Q-10) 200 MG CAPS Take 200 mg by mouth daily.      . Methylcellulose, Laxative, (CITRUCEL) 500 MG TABS Take 1 tablet by mouth daily.       . nitroGLYCERIN (NITROSTAT) 0.4 MG SL tablet Place 1 tablet (0.4 mg total) under the tongue every 5 (five) minutes as needed for chest pain.  25 tablet  6  . pantoprazole (PROTONIX) 40 MG tablet Take 1 tablet (40 mg total) by mouth daily.  30 tablet  11  . rosuvastatin (CRESTOR) 10 MG tablet Take 1 tablet three times per week (Monday / Wednesday / Friday)  30 tablet  5   No current facility-administered medications for this visit.   Allergies  Allergen Reactions  . Simvastatin Other (See Comments)    Muscle paralysis (developed rhabdo in 2004)   . Penicillins     Childhood incident  . Promethazine     hallucinations  . Ezetimibe Other (See Comments)    Took in past by [facility], but doesn't recall why he stopped.  It didn't cause muscle problems like Simvastatin though  . Niacin Other (See Comments)    "hot flashes"

## 2013-10-12 ENCOUNTER — Other Ambulatory Visit (INDEPENDENT_AMBULATORY_CARE_PROVIDER_SITE_OTHER): Payer: 59

## 2013-10-12 ENCOUNTER — Telehealth: Payer: Self-pay | Admitting: Pharmacist

## 2013-10-12 ENCOUNTER — Encounter: Payer: Self-pay | Admitting: Internal Medicine

## 2013-10-12 ENCOUNTER — Ambulatory Visit (INDEPENDENT_AMBULATORY_CARE_PROVIDER_SITE_OTHER): Payer: 59 | Admitting: Internal Medicine

## 2013-10-12 ENCOUNTER — Telehealth: Payer: Self-pay | Admitting: Gastroenterology

## 2013-10-12 VITALS — BP 132/78 | HR 76 | Temp 98.5°F | Resp 16 | Wt 195.0 lb

## 2013-10-12 DIAGNOSIS — R109 Unspecified abdominal pain: Secondary | ICD-10-CM

## 2013-10-12 DIAGNOSIS — E785 Hyperlipidemia, unspecified: Secondary | ICD-10-CM

## 2013-10-12 LAB — BASIC METABOLIC PANEL
BUN: 13 mg/dL (ref 6–23)
CHLORIDE: 100 meq/L (ref 96–112)
CO2: 30 mEq/L (ref 19–32)
Calcium: 9.5 mg/dL (ref 8.4–10.5)
Creatinine, Ser: 1.2 mg/dL (ref 0.4–1.5)
GFR: 66.44 mL/min (ref >60.00)
GLUCOSE: 145 mg/dL — AB (ref 70–99)
POTASSIUM: 3.8 meq/L (ref 3.5–5.1)
Sodium: 137 mEq/L (ref 135–145)

## 2013-10-12 LAB — SEDIMENTATION RATE: Sed Rate: 35 mm/hr — ABNORMAL HIGH (ref 0–22)

## 2013-10-12 LAB — CBC WITH DIFFERENTIAL/PLATELET
BASOS PCT: 0.4 % (ref 0.0–3.0)
Basophils Absolute: 0 10*3/uL (ref 0.0–0.1)
EOS PCT: 1.6 % (ref 0.0–5.0)
Eosinophils Absolute: 0.1 10*3/uL (ref 0.0–0.7)
HEMATOCRIT: 43.1 % (ref 39.0–52.0)
HEMOGLOBIN: 14.8 g/dL (ref 13.0–17.0)
Lymphocytes Relative: 25.1 % (ref 12.0–46.0)
Lymphs Abs: 1.5 10*3/uL (ref 0.7–4.0)
MCHC: 34.2 g/dL (ref 30.0–36.0)
MCV: 87.4 fl (ref 78.0–100.0)
MONO ABS: 0.5 10*3/uL (ref 0.1–1.0)
Monocytes Relative: 8.5 % (ref 3.0–12.0)
NEUTROS ABS: 3.9 10*3/uL (ref 1.4–7.7)
Neutrophils Relative %: 64.4 % (ref 43.0–77.0)
Platelets: 231 10*3/uL (ref 150.0–400.0)
RBC: 4.93 Mil/uL (ref 4.22–5.81)
RDW: 13.3 % (ref 11.5–15.5)
WBC: 6 10*3/uL (ref 4.0–10.5)

## 2013-10-12 LAB — HEPATIC FUNCTION PANEL
ALT: 40 U/L (ref 0–53)
AST: 24 U/L (ref 0–37)
Albumin: 4.1 g/dL (ref 3.5–5.2)
Alkaline Phosphatase: 63 U/L (ref 39–117)
BILIRUBIN DIRECT: 0.1 mg/dL (ref 0.0–0.3)
Total Bilirubin: 0.7 mg/dL (ref 0.2–1.2)
Total Protein: 7 g/dL (ref 6.0–8.3)

## 2013-10-12 LAB — URINALYSIS
Bilirubin Urine: NEGATIVE
HGB URINE DIPSTICK: NEGATIVE
Leukocytes, UA: NEGATIVE
Nitrite: NEGATIVE
PH: 6 (ref 5.0–8.0)
Specific Gravity, Urine: 1.02 (ref 1.000–1.030)
TOTAL PROTEIN, URINE-UPE24: NEGATIVE
URINE GLUCOSE: NEGATIVE
Urobilinogen, UA: 0.2 (ref 0.0–1.0)

## 2013-10-12 LAB — LIPASE: Lipase: 28 U/L (ref 11.0–59.0)

## 2013-10-12 LAB — CK: Total CK: 204 U/L (ref 7–232)

## 2013-10-12 MED ORDER — ROSUVASTATIN CALCIUM 10 MG PO TABS: ORAL_TABLET | ORAL | Status: DC

## 2013-10-12 NOTE — Assessment & Plan Note (Addendum)
6/15 new - bloating I suspect Crestor side effects - hold Cresor Labs Abd CT if not better

## 2013-10-12 NOTE — Assessment & Plan Note (Signed)
Hold Crestor Livalo is another option for the future

## 2013-10-12 NOTE — Patient Instructions (Signed)
Call if worse

## 2013-10-12 NOTE — Telephone Encounter (Signed)
Patient tells me that he developed constipation and abdominal pain last week, and thinks it may be related to increasing his Crestor from tiw up to four times per week.  Have slowly been titrating statin given his h/o rhabdo on Zocor in 2004.  Patient may not be able to tolerate higher Crestor dose than 10 mg tid.  Patient advised to remain off statin for 1 week, then restart at 10 mg tiw.  If abdominal pain doesn't improve, or if LDL remains elevated in 3 months, will likely consider Livalo 1 mg qd at that time.  If Livalo is tried, and also fails, would likely consider PCSK-9 inhibitor at that time if needed.

## 2013-10-12 NOTE — Progress Notes (Signed)
Subjective:    Abdominal Pain This is a new problem. The current episode started in the past 7 days (since Fri). The onset quality is gradual. The pain is located in the generalized abdominal region. The pain is mild. The quality of the pain is dull. Associated symptoms include constipation. Pertinent negatives include no diarrhea, frequency, hematuria or nausea.   Reginald House is now on Crestor 40 mg 4/week - new Rx (w/CoQ10) - has been tolerating ok so far; increased dose last week prior to the onset of his sx's  BP Readings from Last 3 Encounters:  10/12/13 132/78  09/04/13 114/78  04/24/13 132/82   Wt Readings from Last 3 Encounters:  10/12/13 195 lb (88.451 kg)  10/09/13 197 lb (89.359 kg)  09/04/13 195 lb (88.451 kg)      Review of Systems  Constitutional: Negative for appetite change, fatigue and unexpected weight change.  HENT: Negative for congestion, nosebleeds, sneezing, sore throat and trouble swallowing.   Eyes: Negative for itching and visual disturbance.  Respiratory: Negative for cough.   Cardiovascular: Negative for chest pain, palpitations and leg swelling.  Gastrointestinal: Positive for abdominal pain, constipation and abdominal distention. Negative for nausea, diarrhea and blood in stool.  Genitourinary: Negative for frequency and hematuria.  Musculoskeletal: Negative for back pain, gait problem, joint swelling and neck pain.  Skin: Negative for rash.  Neurological: Negative for dizziness, tremors, speech difficulty and weakness.  Psychiatric/Behavioral: Negative for sleep disturbance, dysphoric mood and agitation. The patient is not nervous/anxious.         Objective:   Physical Exam  Constitutional: He is oriented to person, place, and time. He appears well-developed and well-nourished. No distress.  NAD  HENT:  Head: Normocephalic and atraumatic.  Right Ear: External ear normal.  Left Ear: External ear normal.  Nose: Nose normal.  Mouth/Throat:  Oropharynx is clear and moist. No oropharyngeal exudate.  Eyes: Conjunctivae and EOM are normal. Pupils are equal, round, and reactive to light. Right eye exhibits no discharge. Left eye exhibits no discharge. No scleral icterus.  Neck: Normal range of motion. Neck supple. No JVD present. No tracheal deviation present. No thyromegaly present.  Cardiovascular: Normal rate, regular rhythm, normal heart sounds and intact distal pulses.  Exam reveals no gallop and no friction rub.   No murmur heard. Pulmonary/Chest: Effort normal and breath sounds normal. No stridor. No respiratory distress. He has no wheezes. He has no rales. He exhibits no tenderness.  Abdominal: Soft. Bowel sounds are normal. He exhibits no distension and no mass. There is no tenderness. There is no rebound and no guarding.  sensitive  Genitourinary: Rectum normal, prostate normal and penis normal. Guaiac negative stool. No penile tenderness.  Musculoskeletal: Normal range of motion. He exhibits no edema and no tenderness.  Lymphadenopathy:    He has no cervical adenopathy.  Neurological: He is alert and oriented to person, place, and time. He has normal reflexes. No cranial nerve deficit. He exhibits normal muscle tone. Coordination normal.  Skin: Skin is warm and dry. No rash noted. He is not diaphoretic. No erythema. No pallor.  Psychiatric: He has a normal mood and affect. His behavior is normal. Judgment and thought content normal.    Lab Results  Component Value Date   WBC 5.5 01/27/2013   HGB 15.0 01/27/2013   HCT 43.7 01/27/2013   PLT 223.0 01/27/2013   GLUCOSE 98 01/27/2013   CHOL 266* 08/22/2010   TRIG 97 10/05/2013   HDL 41.90 08/22/2010  LDLDIRECT 185.3 08/22/2010   LDLCALC 118* 10/05/2013   ALT 43 10/05/2013   AST 26 10/05/2013   NA 140 01/27/2013   K 4.0 01/27/2013   CL 103 01/27/2013   CREATININE 1.1 01/27/2013   BUN 13 01/27/2013   CO2 31 01/27/2013   TSH 1.99 01/27/2013   PSA 3.78 01/27/2013   INR 1.0 07/14/2008          Assessment & Plan:

## 2013-10-12 NOTE — Telephone Encounter (Signed)
Patient is given an appt for 9:45 on 6/10.  He is advised that I don't have any openings until Wed.  He is advised to see his primary care until office visit on Wed

## 2013-10-12 NOTE — Progress Notes (Signed)
Pre visit review using our clinic review tool, if applicable. No additional management support is needed unless otherwise documented below in the visit note. 

## 2013-10-14 ENCOUNTER — Ambulatory Visit: Payer: 59 | Admitting: Internal Medicine

## 2013-10-30 ENCOUNTER — Telehealth: Payer: Self-pay | Admitting: Pharmacist

## 2013-10-30 DIAGNOSIS — E785 Hyperlipidemia, unspecified: Secondary | ICD-10-CM

## 2013-10-30 DIAGNOSIS — Z79899 Other long term (current) drug therapy: Secondary | ICD-10-CM

## 2013-10-30 MED ORDER — PITAVASTATIN CALCIUM 2 MG PO TABS: 1.0000 mg | ORAL_TABLET | Freq: Every day | ORAL | Status: DC

## 2013-10-30 NOTE — Telephone Encounter (Signed)
Patient tells me that his constipation improved after stopping Crestor, but after restarting Crestor at just once weekly, his constipation returned.  It appears he can't tolerate Crestor, and due to h/o rhabdo with simvastatin, we will need to try a different agent.  We have been keeping an eye on his CK levels on Crestor, and they have been stable.  Will have patient start low dose Livalo (1 mg qd) and check CK level in 3 weeks on 11/18/13.  If this is normal, will continue livalo until future blood work in 01/2014 which is already scheduled.  Patient agreeable to plan.  Left samples up front for patient to start.  To Dr. Meda Coffee as Juluis Rainier only.

## 2013-11-18 ENCOUNTER — Other Ambulatory Visit: Payer: 59

## 2013-11-18 ENCOUNTER — Other Ambulatory Visit (INDEPENDENT_AMBULATORY_CARE_PROVIDER_SITE_OTHER): Payer: 59

## 2013-11-18 DIAGNOSIS — E785 Hyperlipidemia, unspecified: Secondary | ICD-10-CM

## 2013-11-18 DIAGNOSIS — C4492 Squamous cell carcinoma of skin, unspecified: Secondary | ICD-10-CM

## 2013-11-18 DIAGNOSIS — Z79899 Other long term (current) drug therapy: Secondary | ICD-10-CM

## 2013-11-18 HISTORY — DX: Squamous cell carcinoma of skin, unspecified: C44.92

## 2013-11-18 LAB — HEPATIC FUNCTION PANEL
ALT: 35 U/L (ref 0–53)
AST: 20 U/L (ref 0–37)
Albumin: 4 g/dL (ref 3.5–5.2)
Alkaline Phosphatase: 56 U/L (ref 39–117)
Bilirubin, Direct: 0 mg/dL (ref 0.0–0.3)
Total Bilirubin: 0.6 mg/dL (ref 0.2–1.2)
Total Protein: 6.8 g/dL (ref 6.0–8.3)

## 2013-11-18 LAB — CARDIAC PANEL
CK-MB: 2.2 ng/mL (ref 0.3–4.0)
Relative Index: 1.2 calc (ref 0.0–2.5)
Total CK: 182 U/L (ref 7–232)

## 2013-11-20 LAB — NMR LIPOPROFILE WITH LIPIDS
Cholesterol, Total: 178 mg/dL (ref <200)
HDL Particle Number: 31.9 umol/L (ref >=30.5)
HDL Size: 8.3 nm — ABNORMAL LOW (ref >=9.2)
HDL-C: 42 mg/dL (ref >=40)
LDL (calc): 109 mg/dL — ABNORMAL HIGH (ref <100)
LDL Particle Number: 1699 nmol/L — ABNORMAL HIGH (ref <1000)
LDL Size: 20.3 nm — ABNORMAL LOW (ref >20.5)
LP-IR Score: 72 — ABNORMAL HIGH (ref <=45)
Large HDL-P: <1.3 umol/L — ABNORMAL LOW (ref >=4.8)
Large VLDL-P: 3.3 nmol/L — ABNORMAL HIGH (ref <=2.7)
Small LDL Particle Number: 978 nmol/L — ABNORMAL HIGH (ref <=527)
Triglycerides: 133 mg/dL (ref <150)
VLDL Size: 51.2 nm — ABNORMAL HIGH (ref <=46.6)

## 2013-11-30 ENCOUNTER — Telehealth: Payer: Self-pay | Admitting: Pharmacist

## 2013-11-30 DIAGNOSIS — E785 Hyperlipidemia, unspecified: Secondary | ICD-10-CM

## 2013-11-30 DIAGNOSIS — Z79899 Other long term (current) drug therapy: Secondary | ICD-10-CM

## 2013-11-30 NOTE — Telephone Encounter (Signed)
Patient has CK, LFTs, and NMR drawn a few weeks ago after being started on low dose Livalo 1 mg qd.  Patient has a h/o rhabdo on simvastatin, and couldn't tolerate Crestor either.  He tells me is tolerating Livalo 1 mg qd well, his CK is stable at 180, his LDL-P number is down to 1699 (baseline ~ 2500), and LFTs normal.  He is going to [place] for 3 weeks.  I told patient to continue livalo 1 mg qd for the next 3 weeks, but to increase to 2 mg qd once he returns from [place]. He will recheck NMR/LFTs/CK in 01/2014 and will see me 2 days later.

## 2013-12-28 ENCOUNTER — Other Ambulatory Visit: Payer: Self-pay | Admitting: Pharmacist

## 2013-12-28 MED ORDER — PITAVASTATIN CALCIUM 2 MG PO TABS: 2.0000 mg | ORAL_TABLET | Freq: Every day | ORAL | Status: DC

## 2014-01-19 ENCOUNTER — Other Ambulatory Visit (INDEPENDENT_AMBULATORY_CARE_PROVIDER_SITE_OTHER): Payer: 59

## 2014-01-19 DIAGNOSIS — E785 Hyperlipidemia, unspecified: Secondary | ICD-10-CM

## 2014-01-19 DIAGNOSIS — Z79899 Other long term (current) drug therapy: Secondary | ICD-10-CM

## 2014-01-19 LAB — HEPATIC FUNCTION PANEL
ALT: 39 U/L (ref 0–53)
AST: 20 U/L (ref 0–37)
Albumin: 4.1 g/dL (ref 3.5–5.2)
Alkaline Phosphatase: 57 U/L (ref 39–117)
Bilirubin, Direct: 0.1 mg/dL (ref 0.0–0.3)
Total Bilirubin: 1 mg/dL (ref 0.2–1.2)
Total Protein: 6.9 g/dL (ref 6.0–8.3)

## 2014-01-21 ENCOUNTER — Ambulatory Visit (INDEPENDENT_AMBULATORY_CARE_PROVIDER_SITE_OTHER): Payer: 59 | Admitting: Pharmacist

## 2014-01-21 ENCOUNTER — Ambulatory Visit: Payer: 59

## 2014-01-21 VITALS — Wt 201.0 lb

## 2014-01-21 DIAGNOSIS — E785 Hyperlipidemia, unspecified: Secondary | ICD-10-CM

## 2014-01-21 DIAGNOSIS — Z79899 Other long term (current) drug therapy: Secondary | ICD-10-CM

## 2014-01-21 LAB — NMR LIPOPROFILE WITH LIPIDS
Cholesterol, Total: 183 mg/dL (ref 100–199)
HDL Particle Number: 28.7 umol/L — ABNORMAL LOW (ref >=30.5)
HDL Size: 8.4 nm — ABNORMAL LOW (ref >=9.2)
HDL-C: 36 mg/dL — ABNORMAL LOW (ref >39)
LDL (calc): 113 mg/dL — ABNORMAL HIGH (ref 0–99)
LDL Particle Number: 1532 nmol/L — ABNORMAL HIGH (ref <1000)
LDL Size: 21 nm (ref >=20.8)
LP-IR Score: 70 — ABNORMAL HIGH (ref <=45)
Large HDL-P: <1.3 umol/L — ABNORMAL LOW (ref >=4.8)
Large VLDL-P: 3.1 nmol/L — ABNORMAL HIGH (ref <=2.7)
Small LDL Particle Number: 663 nmol/L — ABNORMAL HIGH (ref <=527)
Triglycerides: 169 mg/dL — ABNORMAL HIGH (ref 0–149)
VLDL Size: 46.1 nm (ref <=46.6)

## 2014-01-21 LAB — CARDIAC PANEL
CK-MB: 2.5 ng/mL (ref 0.3–4.0)
Relative Index: 1.2 calc (ref 0.0–2.5)
Total CK: 205 U/L (ref 7–232)

## 2014-01-21 MED ORDER — PITAVASTATIN CALCIUM 4 MG PO TABS
1.0000 | ORAL_TABLET | Freq: Every day | ORAL | Status: DC
Start: 2014-01-21 — End: 2016-09-24

## 2014-01-21 NOTE — Assessment & Plan Note (Addendum)
Called patient and informed him that CK level was stable (205), and that he should increase Livalo to 4 mg qd as we discussed.  LDL-P number trending down since increasing Livalo to 2 mg qd last month.  LFTs normal, CK normal as well.  Patient tolerating Livalo well currently.  Will increase to Livalo 4 mg qd.  He agrees to restart exercising again as he has not been active for past few months due to work schedule.  Will reassess NMR, LFTs, and CK in 3 months.  May be able to consider PCSK-9 inhibitor if needed in 3 months since he can't tolerate Zetia, BAS, or higher potency statins.  Patient agreeable to this.

## 2014-01-21 NOTE — Patient Instructions (Signed)
1.  Will call with result of muscle enzyme test. 2.  Will likely increase Livalo to 4 mg daily at that time. 3.  Will recheck blood work in 3 months (04/20/14 - fasting labs), and see Elberta Leatherwood on 04/22/14 at 8:30 am to discuss.  If LDL remains elevated, may consider Praluent at that time.  [phone]

## 2014-01-21 NOTE — Progress Notes (Signed)
Patient referred to me by Dr. Meda Coffee for lipid management due to patient have historical elevated cholesterol, h/o hospitalization when on simvastatin (rhabdomyolysis), and non-obstructive CAD with elevated CAC .  Patient has 50-70% stenosis in his mid LAD.  Coronary calcium score of 25. This was 35 percentile for age and sex matched control.  Patient has failed multiple statins including Lipitor (muscle aches), simvastatin (rhabdo), Crestor (constipation), and also failed Welchol and Zetia due to GI upset.  Currently he is taking Livalo 2 mg qd and tolerating it well.  CK normal and stable on Livalo 2 mg qd.  Patient currently taking Livalo 2 mg qd, and is tolerating this well.  His CK levels have stayed between 200-300 U/L on statin (were 300 at baseline, 266 four weeks after taking Crestor once weekly, 223 eight weeks after taking Crestor once weekly, was 247 on Crestor biw dosing, 270 U/L on Crestor tiw dosing, 182 on Livalo 1 mg qd, and 205 U/L on Livalo 2 mg qd).  No complaints of muscle aches.  LFTs normal also.  Cholesterol much improved since increasing Livalo to 2 mg qd.  Patient is very happy to hear results.  Would like to get LDL to < 100, and LDL-P closer to 1000 or less if possible.  LDL-P currently 1532, and LDL 113 mg/dL.    Patient has hospitalized in 2004 after waking up and being unable to move / "paralyzed".  Patient was found to have a CK of 5,237 in the hospital and was on simvastatin at that time.  His renal function remained stable, and after remaining off statin and staying hydrated, his CK levels came back down and patient regained all mobility.  He was seen at [facility] lipid clinic after this where he tells me he tried Welchol, Zetia, and Niacin.  The niacin caused "hot flashes" and the other two were stopped due to GI upset and lack of LDL lowering.  He tells me that none of these non-statins caused the same muscle aches as the simvastatin did.  Lipitor did cause muscle aches as well.  Two of his colleagues recently had CABG performed, so patient is interested in treating cholesterol aggressively assuming we can find an agent that is both safe/tolerated and effective.  Risk factors:  Non-obstructive disease (Mid LAD has a moderate non-calcified plaque with associated stenosis 50-70%) and elevated CAC of 73% matched age/sex, age.  Goals:  LDL goal < 70, non-HDL goal < 100, LDL-P number goal < 1000. Meds:  Livalo 2 mg qd Intolerant:  Simvastatin (CK of 5,000 and total muscle "paralysis" requiring hospitalization / rhabdo), Lipitor (muscle aches - with PCP), Crestor (constipation and GI upset, Niacin ("hot flashes" that never improved).  Zetia (GI upset and lack of LDL lowering); Welchol stopped due to GI upset as well, and also lack of LDL lowering.  Social history:  Never smoked.  Drinks ~ 2 glasses of wine per week.  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  He typically travels out of the country once per month, quite often to [place] and [place].   Family history:  All males in his family for the past 4 generations have died from complications of diabetes (MI, CVA, amputation, etc).  He and his brother do not have diabetes at this time.  Father died at 70. Mother was healthy.  Diet:  Patient travels a lot for work.  He is no longer eating much meat, and if so it is chicken or fish.  He no longer is eating red meat.  Typically breakfast consists of cereal or fruit.  Lunch is a sandwich or salad.  Dinner is typically at home, unless he is on the road then he is eating out.  He tries to avoid red meat and fatty foods when eating out.  He has lost a few pounds over last couple of months by being more conscious of what he's eating.  Weight today in clinic was 201 lbs, which is up 4 lbs since our last visit. Exercise:  No regular exercise given his current work schedule. He does have access to treadmill or elliptical when he travels and is willing to use  these.  Labs: 01/2014 -   LDL-P number 1532, LDL 113, HDL 36, TG 169, TC 183, LFTs normal, CK 205 (Livalo 2 mg qd) 11/2013 - LDL-P nmber 1699, LDL 109, TC 178, CK 182 (Livalo 1 mg qd) 10/2013 - LDL-P 1575, LDL 118, TC 178, TG 97, HDL 41, CK 270, LFTs normal (Crestor 10 mg tiw) 08/2013 - CK (247 (Crestor biw) 06/2013 - LDL-P 2469, small LDL-P 1244, LDL size 20.6, TC 264, TG 213, HDL 38, LDL 183; CK 223, LFTs normal (on Crestor 10 mg qweek x 8 weeks) 05/2013 - CK 266 (on Crestor qk x 4 weeks) 04/2013 - CK 308 - Baseline (started Crestor 10 mg qweek today) 01/2013 - LDL-P number 2161 (goal < 1000), small LDL-P 1471, LDL size 20.2, glucose 98, LFTs okay (not on lipid lowering therapy) 08/2010 - TC 266, LDL 185, TG 212, HDL 42 (not on therapy)   Current Outpatient Prescriptions  Medication Sig Dispense Refill  . aspirin 325 MG tablet Take 325 mg by mouth daily.        . Coenzyme Q10 (CO Q-10) 200 MG CAPS Take 200 mg by mouth daily.      Marland Kitchen esomeprazole (NEXIUM) 40 MG capsule Take 40 mg by mouth daily at 12 noon.      . Methylcellulose, Laxative, (CITRUCEL) 500 MG TABS Take 1 tablet by mouth daily.       . nitroGLYCERIN (NITROSTAT) 0.4 MG SL tablet Place 1 tablet (0.4 mg total) under the tongue every 5 (five) minutes as needed for chest pain.  25 tablet  6  . pantoprazole (PROTONIX) 40 MG tablet Take 1 tablet (40 mg total) by mouth daily.  30 tablet  11  . Pitavastatin Calcium (LIVALO) 2 MG TABS Take 1 tablet (2 mg total) by mouth daily.  30 tablet  5   No current facility-administered medications for this visit.   Allergies  Allergen Reactions  . Simvastatin Other (See Comments)    Muscle paralysis (developed rhabdo in 2004)   . Lipitor [Atorvastatin]     Muscle aches  . Penicillins     Childhood incident  . Promethazine     hallucinations  . Welchol [Colesevelam Hcl]     GI upset per patient  . Ezetimibe Other (See Comments)    Took in past by [facility], caused some muscle stiffness and GI  upset he states  . Niacin Other (See Comments)    "hot flashes"

## 2014-04-20 ENCOUNTER — Other Ambulatory Visit (INDEPENDENT_AMBULATORY_CARE_PROVIDER_SITE_OTHER): Payer: 59 | Admitting: *Deleted

## 2014-04-20 DIAGNOSIS — Z79899 Other long term (current) drug therapy: Secondary | ICD-10-CM

## 2014-04-20 DIAGNOSIS — E785 Hyperlipidemia, unspecified: Secondary | ICD-10-CM

## 2014-04-20 LAB — HEPATIC FUNCTION PANEL
ALT: 46 U/L (ref 0–53)
AST: 23 U/L (ref 0–37)
Albumin: 4.2 g/dL (ref 3.5–5.2)
Alkaline Phosphatase: 53 U/L (ref 39–117)
Bilirubin, Direct: 0 mg/dL (ref 0.0–0.3)
Total Bilirubin: 0.7 mg/dL (ref 0.2–1.2)
Total Protein: 6.8 g/dL (ref 6.0–8.3)

## 2014-04-20 LAB — CK: Total CK: 189 U/L (ref 7–232)

## 2014-04-22 ENCOUNTER — Ambulatory Visit (INDEPENDENT_AMBULATORY_CARE_PROVIDER_SITE_OTHER): Payer: 59 | Admitting: Pharmacist Clinician (PhC)/ Clinical Pharmacy Specialist

## 2014-04-22 ENCOUNTER — Encounter: Payer: Self-pay | Admitting: Pharmacist Clinician (PhC)/ Clinical Pharmacy Specialist

## 2014-04-22 VITALS — Ht 67.0 in | Wt 200.2 lb

## 2014-04-22 DIAGNOSIS — E785 Hyperlipidemia, unspecified: Secondary | ICD-10-CM

## 2014-04-22 LAB — NMR LIPOPROFILE WITH LIPIDS
Cholesterol, Total: 168 mg/dL (ref 100–199)
HDL Particle Number: 31.9 umol/L (ref >=30.5)
HDL Size: 8.5 nm — ABNORMAL LOW (ref >=9.2)
HDL-C: 43 mg/dL (ref >39)
LDL (calc): 102 mg/dL — ABNORMAL HIGH (ref 0–99)
LDL Particle Number: 1311 nmol/L — ABNORMAL HIGH (ref <1000)
LDL Size: 20.4 nm (ref >=20.8)
LP-IR Score: 54 — ABNORMAL HIGH (ref <=45)
Large HDL-P: 2.5 umol/L — ABNORMAL LOW (ref >=4.8)
Large VLDL-P: 1.2 nmol/L (ref <=2.7)
Small LDL Particle Number: 654 nmol/L — ABNORMAL HIGH (ref <=527)
Triglycerides: 113 mg/dL (ref 0–149)
VLDL Size: 44 nm (ref <=46.6)

## 2014-04-22 NOTE — Patient Instructions (Signed)
Continue with Livalo 4 mg and Citrucel each day  Continue to maintain healthy diet, less red meats, more fish and chicken  Exercise as you're able.  20 minutes on an eliptical or treadmill just 2-3 times a week is a good start  Repeat labs in 6 months.  We will call with the results and determine if you need to be seen at that time

## 2014-04-22 NOTE — Assessment & Plan Note (Addendum)
LDL goal of <100, now at 102 on max dose of Livalo.  Pt was intolerant to all other statins, Zetia, Welchol.  At this time we will continue with current therapy.  He understands our limitations on adding other medications and at this time he would not qualify for a PCSK-9 inhibitor.  He would prefer to continue with his current medication regimen and try to increase his exercise.  We will repeat labs in 6 months.  If they remain the same we can call patient with results.  If they worsen, we will schedule another clinic appointment.

## 2014-04-22 NOTE — Progress Notes (Signed)
Patient referred to me by Dr. Meda Coffee for lipid management due to patient have historical elevated cholesterol, h/o hospitalization when on simvastatin (rhabdomyolysis), and non-obstructive CAD with elevated CAC .  Patient has 50-70% stenosis in his mid LAD.  Coronary calcium score of 25. This was 15 percentile for age and sex matched control.  Patient has failed multiple statins including Lipitor (muscle aches), simvastatin (rhabdo), Crestor (constipation), and also failed Welchol and Zetia due to GI upset.  Currently he is taking Livalo 4 mg qd and tolerating it well.  CK normal and stable on Livalo 2 mg qd.  Patient has hospitalized in 2004 after waking up and being unable to move / "paralyzed".  Patient was found to have a CK of 5,237 in the hospital and was on simvastatin at that time.  His renal function remained stable, and after remaining off statin and staying hydrated, his CK levels came back down and patient regained all mobility.  He was seen at [facility] lipid clinic after this where he tells me he tried Welchol, Zetia, and Niacin.  The niacin caused "hot flashes" and the other two were stopped due to GI upset and lack of LDL lowering.  He tells me that none of these non-statins caused the same muscle aches as the simvastatin did.  Lipitor did cause muscle aches as well. Two of his colleagues recently had CABG performed, so patient is interested in treating cholesterol aggressively assuming we can find an agent that is both safe/tolerated and effective.  Patient currently taking Livalo 4 mg qd, and is tolerating this well.  His CK levels have stayed between 200-300 U/L on statin (were 300 at baseline, 266 four weeks after taking Crestor once weekly, 223 eight weeks after taking Crestor once weekly, was 247 on Crestor biw dosing, 270 U/L on Crestor tiw dosing, 182 on Livalo 1 mg qd, and 205 U/L on Livalo 2 mg qd).  No complaints of muscle aches.  LFTs normal also. LDL cholesterol has dropped to 102 with  Livalo 4 mg.   Would like to get LDL to < 100, and LDL-P closer to 1000 or less if possible.  LDL-P currently 1311.  Risk factors:  Non-obstructive disease (Mid LAD has a moderate non-calcified plaque with associated stenosis 50-70%) and elevated CAC of 73% matched age/sex, age.  Goals:  LDL goal < 70, non-HDL goal < 100, LDL-P number goal < 1000. Meds:  Livalo 4 mg qd Intolerant:  Simvastatin (CK of 5,000 and total muscle "paralysis" requiring hospitalization / rhabdo), Lipitor (muscle aches - with PCP), Crestor (constipation and GI upset, Niacin ("hot flashes" that never improved).  Zetia (GI upset and lack of LDL lowering); Welchol stopped due to GI upset as well, and also lack of LDL lowering.  Social history:  Never smoked.  Drinks ~ 2 glasses of wine per week.  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  He typically travels out of the country once or twice per month, quite often to [place] and [place].    Family history:  All males in his family for the past 4 generations have died from complications of diabetes (MI, CVA, amputation, etc).  Neither he nor his brother have developed diabetes at this time  Father died at 65. Mother was healthy.  Diet:  Patient travels a lot for work.  He is no longer eating much meat, and if so it is chicken or fish.  He no longer is eating red meat.  Typically breakfast consists  of cereal or fruit.  Lunch is a sandwich or salad.  Dinner is typically at home, unless he is on the road then he is eating out.  He tries to avoid red meat and fatty foods when eating out.  Most foreign countries have better access to fresh foods, therefore he actually eats healthy when on the road.   Weight today in clinic was 200 lbs, consistent with his last visit.  Exercise:  No regular exercise given his current work schedule. He does have access to treadmill or elliptical when he travels and is willing to use these.  Labs: 04/2014 -  LDL-P number 1311, LDL 102,  HDL 43, TG 113, TC 168, LFTs normal, CK 189 (Livalo 4 mg qd) 01/2014 -   LDL-P number 2094, LDL 113, HDL 36, TG 169, TC 183, LFTs normal, CK 205 (Livalo 2 mg qd) 11/2013 - LDL-P nmber 1699, LDL 109, TC 178, CK 182 (Livalo 1 mg qd) 10/2013 - LDL-P 1575, LDL 118, TC 178, TG 97, HDL 41, CK 270, LFTs normal (Crestor 10 mg tiw) 08/2013 - CK (247 (Crestor biw) 06/2013 - LDL-P 2469, small LDL-P 1244, LDL size 20.6, TC 264, TG 213, HDL 38, LDL 183; CK 223, LFTs normal (on Crestor 10 mg qweek x 8 weeks) 05/2013 - CK 266 (on Crestor qk x 4 weeks) 04/2013 - CK 308 - Baseline (started Crestor 10 mg qweek today) 01/2013 - LDL-P number 2161 (goal < 1000), small LDL-P 1471, LDL size 20.2, glucose 98, LFTs okay (not on lipid lowering therapy) 08/2010 - TC 266, LDL 185, TG 212, HDL 42 (not on therapy)   Current Outpatient Prescriptions  Medication Sig Dispense Refill  . aspirin 325 MG tablet Take 325 mg by mouth daily.      . Coenzyme Q10 (CO Q-10) 200 MG CAPS Take 200 mg by mouth daily.    Marland Kitchen esomeprazole (NEXIUM) 40 MG capsule Take 40 mg by mouth daily at 12 noon.    . Methylcellulose, Laxative, (CITRUCEL) 500 MG TABS Take 1 tablet by mouth daily.     . nitroGLYCERIN (NITROSTAT) 0.4 MG SL tablet Place 1 tablet (0.4 mg total) under the tongue every 5 (five) minutes as needed for chest pain. 25 tablet 6  . Pitavastatin Calcium (LIVALO) 4 MG TABS Take 1 tablet (4 mg total) by mouth daily. 30 tablet 5   No current facility-administered medications for this visit.   Allergies  Allergen Reactions  . Simvastatin Other (See Comments)    Muscle paralysis (developed rhabdo in 2004)   . Lipitor [Atorvastatin]     Muscle aches  . Penicillins     Childhood incident  . Promethazine     hallucinations  . Welchol [Colesevelam Hcl]     GI upset per patient  . Ezetimibe Other (See Comments)    Took in past by [facility], caused some muscle stiffness and GI upset he states  . Niacin Other (See Comments)    "hot flashes"

## 2014-05-10 ENCOUNTER — Telehealth: Payer: Self-pay | Admitting: Pharmacist

## 2014-05-10 NOTE — Telephone Encounter (Signed)
Pt called to report having issues with Livalo.  He states he started having severe constipation with Livalo for the second time.  He states he stopped about 10 days ago and it resolved within about 24 hours.  He has had issues with all other cholesterol medications.  Will investigate to see if coverage has changed for PCSK-9 and see if he will qualify at this time.

## 2014-07-03 ENCOUNTER — Other Ambulatory Visit: Payer: Self-pay | Admitting: Cardiology

## 2014-08-31 ENCOUNTER — Other Ambulatory Visit: Payer: Self-pay | Admitting: Dermatology

## 2014-10-20 ENCOUNTER — Other Ambulatory Visit: Payer: 59

## 2014-10-26 ENCOUNTER — Ambulatory Visit: Payer: Self-pay | Admitting: Cardiology

## 2015-05-12 ENCOUNTER — Encounter: Payer: Self-pay | Admitting: Gastroenterology

## 2015-12-08 ENCOUNTER — Encounter: Payer: Self-pay | Admitting: Internal Medicine

## 2016-08-06 ENCOUNTER — Encounter: Payer: Self-pay | Admitting: Internal Medicine

## 2016-09-24 ENCOUNTER — Ambulatory Visit (AMBULATORY_SURGERY_CENTER): Payer: Self-pay

## 2016-09-24 ENCOUNTER — Encounter: Payer: Self-pay | Admitting: Internal Medicine

## 2016-09-24 VITALS — Ht 66.0 in | Wt 204.8 lb

## 2016-09-24 DIAGNOSIS — Z1211 Encounter for screening for malignant neoplasm of colon: Secondary | ICD-10-CM

## 2016-09-24 MED ORDER — NA SULFATE-K SULFATE-MG SULF 17.5-3.13-1.6 GM/177ML PO SOLN: 1.0000 | Freq: Once | ORAL | 0 refills | Status: AC

## 2016-09-24 NOTE — Progress Notes (Signed)
Denies allergies to eggs or soy products. Denies complication of anesthesia or sedation. Denies use of weight loss medication. Denies use of O2.   Emmi instructions given for colonoscopy.  

## 2016-10-03 ENCOUNTER — Encounter: Payer: Self-pay | Admitting: Internal Medicine

## 2016-10-03 ENCOUNTER — Ambulatory Visit (AMBULATORY_SURGERY_CENTER): Payer: 59 | Admitting: Internal Medicine

## 2016-10-03 VITALS — BP 141/76 | HR 71 | Temp 97.1°F | Resp 19 | Ht 66.0 in | Wt 204.0 lb

## 2016-10-03 DIAGNOSIS — D124 Benign neoplasm of descending colon: Secondary | ICD-10-CM

## 2016-10-03 DIAGNOSIS — Z1212 Encounter for screening for malignant neoplasm of rectum: Secondary | ICD-10-CM

## 2016-10-03 DIAGNOSIS — K6389 Other specified diseases of intestine: Secondary | ICD-10-CM

## 2016-10-03 DIAGNOSIS — D120 Benign neoplasm of cecum: Secondary | ICD-10-CM

## 2016-10-03 DIAGNOSIS — Z1211 Encounter for screening for malignant neoplasm of colon: Secondary | ICD-10-CM

## 2016-10-03 MED ORDER — SODIUM CHLORIDE 0.9 % IV SOLN: 500.0000 mL | INTRAVENOUS | Status: DC

## 2016-10-03 NOTE — Progress Notes (Signed)
Called to room to assist during endoscopic procedure.  Patient ID and intended procedure confirmed with present staff. Received instructions for my participation in the procedure from the performing physician.  

## 2016-10-03 NOTE — Progress Notes (Signed)
A/ox3 pleased with MAC, report to Jane RN 

## 2016-10-03 NOTE — Progress Notes (Signed)
Pt's states no medical or surgical changes since previsit or office visit. 

## 2016-10-03 NOTE — Patient Instructions (Signed)
YOU HAD AN ENDOSCOPIC PROCEDURE TODAY AT [facility]:   Refer to the procedure report that was given to you for any specific questions about what was found during the examination.  If the procedure report does not answer your questions, please call your gastroenterologist to clarify.  If you requested that your care partner not be given the details of your procedure findings, then the procedure report has been included in a sealed envelope for you to review at your convenience later.  YOU SHOULD EXPECT: Some feelings of bloating in the abdomen. Passage of more gas than usual.  Walking can help get rid of the air that was put into your GI tract during the procedure and reduce the bloating. If you had a lower endoscopy (such as a colonoscopy or flexible sigmoidoscopy) you may notice spotting of blood in your stool or on the toilet paper. If you underwent a bowel prep for your procedure, you may not have a normal bowel movement for a few days.  Please Note:  You might notice some irritation and congestion in your nose or some drainage.  This is from the oxygen used during your procedure.  There is no need for concern and it should clear up in a day or so.  SYMPTOMS TO REPORT IMMEDIATELY:   Following lower endoscopy (colonoscopy or flexible sigmoidoscopy):  Excessive amounts of blood in the stool  Significant tenderness or worsening of abdominal pains  Swelling of the abdomen that is new, acute  Fever of 100F or higher   For urgent or emergent issues, a gastroenterologist can be reached at any hour by calling [phone].   DIET:  We do recommend a small meal at first, but then you may proceed to your regular diet.  Drink plenty of fluids but you should avoid alcoholic beverages for 24 hours.  ACTIVITY:  You should plan to take it easy for the rest of today and you should NOT DRIVE or use heavy machinery until tomorrow (because of the sedation medicines used during the test).     FOLLOW UP: Our staff will call the number listed on your records the next business day following your procedure to check on you and address any questions or concerns that you may have regarding the information given to you following your procedure. If we do not reach you, we will leave a message.  However, if you are feeling well and you are not experiencing any problems, there is no need to return our call.  We will assume that you have returned to your regular daily activities without incident.  Polyps, diverticulosis, high fiber diet and hemorrhoid information given.  If any biopsies were taken you will be contacted by phone or by letter within the next 1-3 weeks.  Please call us at [phone] if you have not heard about the biopsies in 3 weeks.    SIGNATURES/CONFIDENTIALITY: You and/or your care partner have signed paperwork which will be entered into your electronic medical record.  These signatures attest to the fact that that the information above on your After Visit Summary has been reviewed and is understood.  Full responsibility of the confidentiality of this discharge information lies with you and/or your care-partner.

## 2016-10-03 NOTE — Op Note (Signed)
Reginald House Patient Name: Reginald House Procedure Date: 10/03/2016 12:09 PM MRN: [id number] Endoscopist: Jerene Bears , MD Age: 56 Referring MD:  Date of Birth: 1961-05-02 Gender: Male Account #: [id number] Procedure:                Colonoscopy Indications:              Screening for colorectal malignant neoplasm Medicines:                Monitored Anesthesia Care Procedure:                Pre-Anesthesia Assessment:                           - Prior to the procedure, a History and Physical                            was performed, and patient medications and                            allergies were reviewed. The patient's tolerance of                            previous anesthesia was also reviewed. The risks                            and benefits of the procedure and the sedation                            options and risks were discussed with the patient.                            All questions were answered, and informed consent                            was obtained. Prior Anticoagulants: The patient has                            taken no previous anticoagulant or antiplatelet                            agents. ASA Grade Assessment: II - A patient with                            mild systemic disease. After reviewing the risks                            and benefits, the patient was deemed in                            satisfactory condition to undergo the procedure.                           After obtaining informed consent, the colonoscope  was passed under direct vision. Throughout the                            procedure, the patient's blood pressure, pulse, and                            oxygen saturations were monitored continuously. The                            Colonoscope was introduced through the anus and                            advanced to the the cecum, identified by                            appendiceal orifice and ileocecal  valve. The                            colonoscopy was performed without difficulty. The                            patient tolerated the procedure well. The quality                            of the bowel preparation was good. The ileocecal                            valve, appendiceal orifice, and rectum were                            photographed. Scope In: 12:28:04 PM Scope Out: 45:80:99 PM Scope Withdrawal Time: 0 hours 15 minutes 31 seconds  Total Procedure Duration: 0 hours 18 minutes 5 seconds  Findings:                 The perianal and digital rectal examinations were                            normal.                           Two sessile polyps were found in the cecum. The                            polyps were 3 to 5 mm in size. These polyps were                            removed with a cold snare. Resection and retrieval                            were complete.                           A 15 mm polyp was found in the descending colon.  The polyp was pedunculated. The polyp was removed                            with a hot snare. Resection and retrieval were                            complete.                           A 4 mm polyp was found in the descending colon. The                            polyp was sessile. The polyp was removed with a                            cold snare. Resection and retrieval were complete.                           Multiple medium-mouthed diverticula were found in                            the sigmoid colon and ascending colon. Erythema was                            seen in association with the diverticular opening.                           Non-bleeding internal hemorrhoids were found during                            retroflexion. The hemorrhoids were small. Complications:            No immediate complications. Estimated Blood Loss:     Estimated blood loss was minimal. Impression:               - Two 3 to 5 mm  polyps in the cecum, removed with a                            cold snare. Resected and retrieved.                           - One 15 mm polyp in the descending colon, removed                            with a hot snare. Resected and retrieved.                           - One 4 mm polyp in the descending colon, removed                            with a cold snare. Resected and retrieved.                           - Moderate diverticulosis  in the sigmoid colon and                            in the ascending colon. Erythema was seen in                            association with the diverticular opening.                           - Non-bleeding internal hemorrhoids. Recommendation:           - Patient has a contact number available for                            emergencies. The signs and symptoms of potential                            delayed complications were discussed with the                            patient. Return to normal activities tomorrow.                            Written discharge instructions were provided to the                            patient.                           - Resume previous diet.                           - Continue present medications.                           - Await pathology results.                           - Repeat colonoscopy is recommended for                            surveillance. The colonoscopy date will be                            determined after pathology results from today's                            exam become available for review.                           - No ibuprofen, naproxen, or other non-steroidal                            anti-inflammatory drugs for 2 weeks after polyp                            removal. [name], MD 10/03/2016 12:50:49 PM This report has been signed electronically.

## 2016-10-04 ENCOUNTER — Telehealth: Payer: Self-pay

## 2016-10-04 NOTE — Telephone Encounter (Signed)
  Follow up Call-  Call back number 10/03/2016  Post procedure Call Back phone  # [phone]  Permission to leave phone message Yes  Some recent data might be hidden     Patient questions:  Do you have a fever, pain , or abdominal swelling? No. Pain Score  0 *  Have you tolerated food without any problems? Yes.    Have you been able to return to your normal activities? Yes.    Do you have any questions about your discharge instructions: Diet   No. Medications  No. Follow up visit  No.  Do you have questions or concerns about your Care? No.  Actions: * If pain score is 4 or above: No action needed, pain <4.

## 2016-10-09 ENCOUNTER — Encounter: Payer: Self-pay | Admitting: Internal Medicine

## 2019-09-17 ENCOUNTER — Encounter: Payer: Self-pay | Admitting: Internal Medicine

## 2019-09-30 ENCOUNTER — Other Ambulatory Visit: Payer: Self-pay

## 2019-09-30 ENCOUNTER — Ambulatory Visit (AMBULATORY_SURGERY_CENTER): Payer: Self-pay | Admitting: *Deleted

## 2019-09-30 VITALS — Ht 66.0 in | Wt 178.0 lb

## 2019-09-30 DIAGNOSIS — Z86010 Personal history of colonic polyps: Secondary | ICD-10-CM

## 2019-09-30 MED ORDER — SUTAB 1479-225-188 MG PO TABS: 1.0000 | ORAL_TABLET | Freq: Once | ORAL | 0 refills | Status: AC

## 2019-09-30 NOTE — Progress Notes (Signed)
2nd dose of covid vaccine received in March   Pt is aware that care partner will wait in the car during procedure; if they feel like they will be too hot or cold to wait in the car; they may wait in the 4 th floor lobby. Patient is aware to bring only one care partner. We want them to wear a mask (we do not have any that we can provide them), practice social distancing, and we will check their temperatures when they get here.  I did remind the patient that their care partner needs to stay in the parking lot the entire time and have a cell phone available, we will call them when the pt is ready for discharge. Patient will wear mask into building.   No trouble with anesthesia, difficulty with intubation or hx/fam hx of malignant hyperthermia per pt   No egg or soy allergy  No home oxygen use   No medications for weight loss taken  Pt denies constipation issues   Sutab code put into RX and paper copy given to pt to show pharmacy

## 2019-09-30 NOTE — Addendum Note (Signed)
Addended by: Randall Hiss B on: 09/30/2019 10:07 AM   Modules accepted: Level of Service

## 2019-10-09 ENCOUNTER — Encounter: Payer: Self-pay | Admitting: Internal Medicine

## 2019-10-20 ENCOUNTER — Other Ambulatory Visit: Payer: Self-pay

## 2019-10-20 ENCOUNTER — Ambulatory Visit (AMBULATORY_SURGERY_CENTER): Payer: 59 | Admitting: Internal Medicine

## 2019-10-20 ENCOUNTER — Encounter: Payer: Self-pay | Admitting: Internal Medicine

## 2019-10-20 VITALS — BP 110/44 | HR 68 | Temp 96.6°F | Resp 14 | Ht 66.0 in | Wt 178.0 lb

## 2019-10-20 DIAGNOSIS — D129 Benign neoplasm of anus and anal canal: Secondary | ICD-10-CM

## 2019-10-20 DIAGNOSIS — K621 Rectal polyp: Secondary | ICD-10-CM | POA: Diagnosis not present

## 2019-10-20 DIAGNOSIS — Z86010 Personal history of colonic polyps: Secondary | ICD-10-CM

## 2019-10-20 MED ORDER — SODIUM CHLORIDE 0.9 % IV SOLN
500.0000 mL | Freq: Once | INTRAVENOUS | Status: DC
Start: 2019-10-20 — End: 2019-10-20

## 2019-10-20 NOTE — Progress Notes (Signed)
Vitals-cw  Pt's states no medical or surgical changes since previsit or office visit.    

## 2019-10-20 NOTE — Progress Notes (Signed)
Called to room to assist during endoscopic procedure.  Patient ID and intended procedure confirmed with present staff. Received instructions for my participation in the procedure from the performing physician.  

## 2019-10-20 NOTE — Op Note (Signed)
[facility] Patient Name: Reginald House Procedure Date: 10/20/2019 2:17 PM MRN: [id number] Endoscopist: Jerene Bears , MD Age: 59 Referring MD:  Date of Birth: January 02, 1961 Gender: Male Account #: [id number] Procedure:                Colonoscopy Indications:              High risk colon cancer surveillance: Personal                            history of adenoma with villous component, adenoma                            and sessile serrated polyp, Last colonoscopy: May                            2018 Medicines:                Monitored Anesthesia Care Procedure:                Pre-Anesthesia Assessment:                           - Prior to the procedure, a History and Physical                            was performed, and patient medications and                            allergies were reviewed. The patient's tolerance of                            previous anesthesia was also reviewed. The risks                            and benefits of the procedure and the sedation                            options and risks were discussed with the patient.                            All questions were answered, and informed consent                            was obtained. Prior Anticoagulants: The patient has                            taken no previous anticoagulant or antiplatelet                            agents. ASA Grade Assessment: II - A patient with                            mild systemic disease. After reviewing the risks  and benefits, the patient was deemed in                            satisfactory condition to undergo the procedure.                           After obtaining informed consent, the colonoscope                            was passed under direct vision. Throughout the                            procedure, the patient's blood pressure, pulse, and                            oxygen saturations were monitored continuously. The                             Colonoscope was introduced through the anus and                            advanced to the cecum, identified by appendiceal                            orifice and ileocecal valve. The colonoscopy was                            performed without difficulty. The patient tolerated                            the procedure well. The quality of the bowel                            preparation was good. The ileocecal valve,                            appendiceal orifice, and rectum were photographed. Scope In: 2:48:13 PM Scope Out: 3:01:59 PM Scope Withdrawal Time: 0 hours 11 minutes 24 seconds  Total Procedure Duration: 0 hours 13 minutes 46 seconds  Findings:                 The digital rectal exam was normal.                           The terminal ileum appeared normal.                           A 2 mm polyp was found in the rectum. The polyp was                            sessile. The polyp was removed with a cold biopsy                            forceps. Resection and retrieval were complete.  Multiple small and large-mouthed diverticula were                            found in the sigmoid colon, descending colon and                            transverse colon.                           Internal hemorrhoids were found during                            retroflexion. The hemorrhoids were small. Complications:            No immediate complications. Estimated Blood Loss:     Estimated blood loss was minimal. Impression:               - The examined portion of the ileum was normal.                           - One 2 mm polyp in the rectum, removed with a cold                            biopsy forceps. Resected and retrieved.                           - Diverticulosis in the sigmoid colon, in the                            descending colon and in the transverse colon.                           - Internal hemorrhoids. Recommendation:           - Patient has a  contact number available for                            emergencies. The signs and symptoms of potential                            delayed complications were discussed with the                            patient. Return to normal activities tomorrow.                            Written discharge instructions were provided to the                            patient.                           - Resume previous diet.                           - Continue present medications.                           -  Await pathology results.                           - Repeat colonoscopy in 5 years for surveillance. Jerene Bears, MD 10/20/2019 3:06:08 PM This report has been signed electronically.

## 2019-10-20 NOTE — Patient Instructions (Signed)
Information on polyps, diverticulosis and hemorrhoids given to you today.  Await pathology results.  Resume previous diet and medications.  Repeat colonoscopy in 5 years.   YOU HAD AN ENDOSCOPIC PROCEDURE TODAY AT THE [facility]:   Refer to the procedure report that was given to you for any specific questions about what was found during the examination.  If the procedure report does not answer your questions, please call your gastroenterologist to clarify.  If you requested that your care partner not be given the details of your procedure findings, then the procedure report has been included in a sealed envelope for you to review at your convenience later.  YOU SHOULD EXPECT: Some feelings of bloating in the abdomen. Passage of more gas than usual.  Walking can help get rid of the air that was put into your GI tract during the procedure and reduce the bloating. If you had a lower endoscopy (such as a colonoscopy or flexible sigmoidoscopy) you may notice spotting of blood in your stool or on the toilet paper. If you underwent a bowel prep for your procedure, you may not have a normal bowel movement for a few days.  Please Note:  You might notice some irritation and congestion in your nose or some drainage.  This is from the oxygen used during your procedure.  There is no need for concern and it should clear up in a day or so.  SYMPTOMS TO REPORT IMMEDIATELY:  Following lower endoscopy (colonoscopy or flexible sigmoidoscopy):  Excessive amounts of blood in the stool  Significant tenderness or worsening of abdominal pains  Swelling of the abdomen that is new, acute  Fever of 100F or higher   For urgent or emergent issues, a gastroenterologist can be reached at any hour by calling [phone]. Do not use MyChart messaging for urgent concerns.    DIET:  We do recommend a small meal at first, but then you may proceed to your regular diet.  Drink plenty of fluids but you should  avoid alcoholic beverages for 24 hours.  ACTIVITY:  You should plan to take it easy for the rest of today and you should NOT DRIVE or use heavy machinery until tomorrow (because of the sedation medicines used during the test).    FOLLOW UP: Our staff will call the number listed on your records 48-72 hours following your procedure to check on you and address any questions or concerns that you may have regarding the information given to you following your procedure. If we do not reach you, we will leave a message.  We will attempt to reach you two times.  During this call, we will ask if you have developed any symptoms of COVID 19. If you develop any symptoms (ie: fever, flu-like symptoms, shortness of breath, cough etc.) before then, please call [phone].  If you test positive for Covid 19 in the 2 weeks post procedure, please call and report this information to us.    If any biopsies were taken you will be contacted by phone or by letter within the next 1-3 weeks.  Please call us at [phone] if you have not heard about the biopsies in 3 weeks.    SIGNATURES/CONFIDENTIALITY: You and/or your care partner have signed paperwork which will be entered into your electronic medical record.  These signatures attest to the fact that that the information above on your After Visit Summary has been reviewed and is understood.  Full responsibility of the confidentiality of   discharge information lies with you and/or your care-partner. 

## 2019-10-20 NOTE — Progress Notes (Signed)
To PACU, VSS. Report to [email or web address] 

## 2019-10-22 ENCOUNTER — Telehealth: Payer: Self-pay

## 2019-10-22 NOTE — Telephone Encounter (Signed)
°  Follow up Call-  Call back number 10/20/2019  Post procedure Call Back phone  # [phone]  Permission to leave phone message Yes  Some recent data might be hidden     Patient questions:  Do you have a fever, pain , or abdominal swelling? No. Pain Score  0 *  Have you tolerated food without any problems? Yes.    Have you been able to return to your normal activities? Yes.    Do you have any questions about your discharge instructions: Diet   No. Medications  No. Follow up visit  No.  Do you have questions or concerns about your Care? No.  Actions: * If pain score is 4 or above: No action needed, pain <4.  1. Have you developed a fever since your procedure? no  2.   Have you had an respiratory symptoms (SOB or cough) since your procedure? no  3.   Have you tested positive for COVID 19 since your procedure no  4.   Have you had any family members/close contacts diagnosed with the COVID 19 since your procedure?  no   If yes to any of these questions please route to Joylene John, RN and Erenest Rasher, RN

## 2019-10-26 ENCOUNTER — Encounter: Payer: Self-pay | Admitting: Internal Medicine

## 2020-08-09 ENCOUNTER — Other Ambulatory Visit: Payer: Self-pay | Admitting: Internal Medicine

## 2020-08-09 DIAGNOSIS — K225 Diverticulum of esophagus, acquired: Secondary | ICD-10-CM

## 2020-08-30 ENCOUNTER — Ambulatory Visit
Admission: RE | Admit: 2020-08-30 | Discharge: 2020-08-30 | Disposition: A | Discharge status: Home / Self Care | Payer: 59 | Source: Ambulatory Visit | Attending: Internal Medicine | Admitting: Internal Medicine

## 2020-08-30 DIAGNOSIS — K225 Diverticulum of esophagus, acquired: Secondary | ICD-10-CM

## 2020-09-21 ENCOUNTER — Other Ambulatory Visit: Payer: Self-pay

## 2020-09-21 ENCOUNTER — Other Ambulatory Visit: Payer: Self-pay | Admitting: Dermatology

## 2020-09-21 ENCOUNTER — Encounter: Payer: Self-pay | Admitting: Dermatology

## 2020-09-21 ENCOUNTER — Ambulatory Visit (INDEPENDENT_AMBULATORY_CARE_PROVIDER_SITE_OTHER): Payer: 59 | Admitting: Dermatology

## 2020-09-21 DIAGNOSIS — L570 Actinic keratosis: Secondary | ICD-10-CM | POA: Diagnosis not present

## 2020-09-21 DIAGNOSIS — L918 Other hypertrophic disorders of the skin: Secondary | ICD-10-CM | POA: Diagnosis not present

## 2020-09-21 DIAGNOSIS — Z1283 Encounter for screening for malignant neoplasm of skin: Secondary | ICD-10-CM | POA: Diagnosis not present

## 2020-09-21 DIAGNOSIS — D225 Melanocytic nevi of trunk: Secondary | ICD-10-CM

## 2020-09-21 DIAGNOSIS — D1801 Hemangioma of skin and subcutaneous tissue: Secondary | ICD-10-CM | POA: Diagnosis not present

## 2020-09-21 DIAGNOSIS — L738 Other specified follicular disorders: Secondary | ICD-10-CM

## 2020-09-21 DIAGNOSIS — D485 Neoplasm of uncertain behavior of skin: Secondary | ICD-10-CM

## 2020-09-21 DIAGNOSIS — D229 Melanocytic nevi, unspecified: Secondary | ICD-10-CM

## 2020-09-21 NOTE — Patient Instructions (Signed)

## 2020-09-26 ENCOUNTER — Telehealth: Payer: Self-pay

## 2020-09-26 NOTE — Telephone Encounter (Signed)
-----   Message from Lavonna Monarch, MD sent at 09/23/2020  4:42 PM EDT ----- Please let Mr. Sheahan know that this is precancer that biopsy generally cures, but if there is any residual I will recheck him in the fall.

## 2020-09-26 NOTE — Telephone Encounter (Signed)
Phone call to patient with his pathology results. Patient aware of results.  

## 2020-10-05 ENCOUNTER — Encounter: Payer: Self-pay | Admitting: Dermatology

## 2020-10-05 NOTE — Progress Notes (Signed)
   Follow-Up Visit   Subjective  Reginald House is a 60 y.o. male who presents for the following: Skin Problem (Several skin tags around eyes).  General skin examination, would like to discuss removal of skin tags around eyes. Location:  Duration:  Quality:  Associated Signs/Symptoms: Modifying Factors:  Severity:  Timing: Context:   Objective  Well appearing patient in no apparent distress; mood and affect are within normal limits. Objective  Left Inguinal Area: Wife with patient.  Waist up skin examination showed no atypical pigmented lesions.  1 possible nonmelanoma skin cancer temple will be biopsied.  Objective  Left Upper Eyelid, Right Upper Eyelid: Pedunculated 1 mm flesh-colored papules  Objective  Left Breast: Multiple 1 to 2 mm smooth red papules  Objective  Head - Anterior (Face): 2 mm flesh-colored eccentrically delled papules  Objective  Mid Back: Brown 5 mm macule, no dermoscopic atypia.  Objective  Left Temporal Scalp: 9 mm pink crust, rule out carcinoma in situ.       All skin waist up examined.   Assessment & Plan    Screening exam for skin cancer Left Inguinal Area  Skin tag (2) Left Upper Eyelid; Right Upper Eyelid  May schedule one half surgical appointment in future if removal desired  Cherry angioma Left Breast  Intervention not necessary  Sebaceous hyperplasia Head - Anterior (Face)  told of similar appearance of early BCC so if there were growth or bleeding he would return for biopsy.  Nevus Mid Back  Leave if stable.  Neoplasm of uncertain behavior of skin Left Temporal Scalp  Skin / nail biopsy Type of biopsy: tangential   Informed consent: discussed and consent obtained   Timeout: patient name, date of birth, surgical site, and procedure verified   Anesthesia: the lesion was anesthetized in a standard fashion   Anesthetic:  1% lidocaine w/ epinephrine 1-100,000 local infiltration Instrument used: flexible  razor blade   Hemostasis achieved with: aluminum chloride and electrodesiccation   Outcome: patient tolerated procedure well   Post-procedure details: wound care instructions given    Specimen 1 - Surgical pathology Differential Diagnosis: bcc scc close to previous daa [id number]  Check Margins: No      I, Lavonna Monarch, MD, have reviewed all documentation for this visit.  The documentation on 10/05/20 for the exam, diagnosis, procedures, and orders are all accurate and complete.

## 2020-10-10 ENCOUNTER — Other Ambulatory Visit: Payer: Self-pay | Admitting: Urology

## 2020-10-10 DIAGNOSIS — R972 Elevated prostate specific antigen [PSA]: Secondary | ICD-10-CM

## 2020-10-26 ENCOUNTER — Ambulatory Visit
Admission: RE | Admit: 2020-10-26 | Discharge: 2020-10-26 | Disposition: A | Discharge status: Home / Self Care | Payer: 59 | Source: Ambulatory Visit | Attending: Urology | Admitting: Urology

## 2020-10-26 ENCOUNTER — Other Ambulatory Visit: Payer: Self-pay

## 2020-10-26 DIAGNOSIS — R972 Elevated prostate specific antigen [PSA]: Secondary | ICD-10-CM

## 2020-10-26 MED ORDER — GADOBENATE DIMEGLUMINE 529 MG/ML IV SOLN
16.0000 mL | Freq: Once | INTRAVENOUS | Status: AC | PRN
  Administered 2020-10-26: 16 mL via INTRAVENOUS

## 2021-09-25 ENCOUNTER — Encounter: Payer: Self-pay | Admitting: Dermatology

## 2021-09-25 ENCOUNTER — Ambulatory Visit (INDEPENDENT_AMBULATORY_CARE_PROVIDER_SITE_OTHER): Payer: 59 | Admitting: Dermatology

## 2021-09-25 DIAGNOSIS — D485 Neoplasm of uncertain behavior of skin: Secondary | ICD-10-CM

## 2021-09-25 DIAGNOSIS — L820 Inflamed seborrheic keratosis: Secondary | ICD-10-CM

## 2021-09-25 DIAGNOSIS — C4492 Squamous cell carcinoma of skin, unspecified: Secondary | ICD-10-CM

## 2021-09-25 DIAGNOSIS — C44629 Squamous cell carcinoma of skin of left upper limb, including shoulder: Secondary | ICD-10-CM

## 2021-09-25 DIAGNOSIS — D2339 Other benign neoplasm of skin of other parts of face: Secondary | ICD-10-CM | POA: Diagnosis not present

## 2021-09-25 DIAGNOSIS — D239 Other benign neoplasm of skin, unspecified: Secondary | ICD-10-CM

## 2021-09-25 HISTORY — DX: Squamous cell carcinoma of skin, unspecified: C44.92

## 2021-09-25 NOTE — Patient Instructions (Signed)

## 2021-10-03 ENCOUNTER — Telehealth: Payer: Self-pay

## 2021-10-03 NOTE — Telephone Encounter (Signed)
Phone call to patient with his pathology results. Patient aware of results.  

## 2021-10-03 NOTE — Telephone Encounter (Signed)
-----   Message from Lavonna Monarch, MD sent at 09/27/2021 11:15 PM EDT ----- This was a small lesion and the base was treated at the time of the biopsy.  Unless there is obvious regrowth, no further procedure is needed.

## 2021-10-16 ENCOUNTER — Encounter: Payer: Self-pay | Admitting: Dermatology

## 2021-10-16 NOTE — Progress Notes (Signed)
   Follow-Up Visit   Subjective  Reginald House is a 61 y.o. male who presents for the following: Skin Problem (Skin tag on left arm, left leg and left eye).  Growth on left arm, check several other spots Location:  Duration:  Quality:  Associated Signs/Symptoms: Modifying Factors:  Severity:  Timing: Context:   Objective  Well appearing patient in no apparent distress; mood and affect are within normal limits. Left Upper Arm - Posterior 6 mm volcano like pink papule,?  Early keratoacanthoma/SCCA     Left Popliteal Fossa Inflamed slightly verrucous pink crust  Left Malar Cheek Half millimeter dilated pore/pit in skin compatible with pore of Winer    All skin waist up examined.  Plus leg.   Assessment & Plan    Neoplasm of uncertain behavior of skin Left Upper Arm - Posterior  Skin / nail biopsy Type of biopsy: tangential   Informed consent: discussed and consent obtained   Timeout: patient name, date of birth, surgical site, and procedure verified   Anesthesia: the lesion was anesthetized in a standard fashion   Anesthetic:  1% lidocaine w/ epinephrine 1-100,000 local infiltration Instrument used: flexible razor blade   Hemostasis achieved with: aluminum chloride and electrodesiccation   Outcome: patient tolerated procedure well   Post-procedure details: wound care instructions given    Destruction of lesion Complexity: simple   Destruction method: electrodesiccation and curettage   Informed consent: discussed and consent obtained   Timeout:  patient name, date of birth, surgical site, and procedure verified Anesthesia: the lesion was anesthetized in a standard fashion   Anesthetic:  1% lidocaine w/ epinephrine 1-100,000 local infiltration Curettage performed in three different directions: Yes   Electrodesiccation performed over the curetted area: Yes   Curettage cycles:  3 Lesion length (cm):  1 Lesion width (cm):  1 Margin per side (cm):  0 Final wound  size (cm):  1 Hemostasis achieved with:  aluminum chloride Outcome: patient tolerated procedure well with no complications   Post-procedure details: wound care instructions given    Specimen 1 - Surgical pathology Differential Diagnosis: bcc scc ka tx with bx  Check Margins: No  After shave biopsy the base lesion was curetted and cauterized.  Seborrheic keratosis, inflamed Left Popliteal Fossa  Destruction of lesion - Left Popliteal Fossa Complexity: simple   Destruction method: cryotherapy   Informed consent: discussed and consent obtained   Timeout:  patient name, date of birth, surgical site, and procedure verified Lesion destroyed using liquid nitrogen: Yes   Cryotherapy cycles:  3 Outcome: patient tolerated procedure well with no complications   Post-procedure details: wound care instructions given    Dilated pore of Winer Left Malar Cheek  Patient aware 15-minute surgery with 1-2 stitches if he desires to schedule surgery.      I, Lavonna Monarch, MD, have reviewed all documentation for this visit.  The documentation on 10/16/21 for the exam, diagnosis, procedures, and orders are all accurate and complete.

## 2021-11-23 ENCOUNTER — Ambulatory Visit (INDEPENDENT_AMBULATORY_CARE_PROVIDER_SITE_OTHER): Payer: 59 | Admitting: Dermatology

## 2021-11-23 DIAGNOSIS — L918 Other hypertrophic disorders of the skin: Secondary | ICD-10-CM | POA: Diagnosis not present

## 2021-11-23 NOTE — Patient Instructions (Signed)

## 2021-12-16 ENCOUNTER — Encounter: Payer: Self-pay | Admitting: Dermatology

## 2021-12-16 NOTE — Progress Notes (Signed)
   Follow-Up Visit   Subjective  Reginald House is a 61 y.o. male who presents for the following: Procedure (Here for skin tag removal on left eye.).  Requests removal of several periorbital skin tags. Location:  Duration:  Quality:  Associated Signs/Symptoms: Modifying Factors:  Severity:  Timing: Context:   Objective  Well appearing patient in no apparent distress; mood and affect are within normal limits. Left Upper Eyelid (4), Right Upper Eyelid (4) Fleshy, skin-colored sessile and pedunculated papules.      A focused examination was performed including the neck.. Relevant physical exam findings are noted in the Assessment and Plan.   Assessment & Plan    Skin tag (8) Left Upper Eyelid (4); Right Upper Eyelid (4)  Epidermal / dermal shaving - Left Upper Eyelid, Right Upper Eyelid  Informed consent: discussed and consent obtained   Timeout: patient name, date of birth, surgical site, and procedure verified   Instrument used: scissors   Hemostasis achieved with: ferric subsulfate   Outcome: patient tolerated procedure well        I, Lavonna Monarch, MD, have reviewed all documentation for this visit.  The documentation on 12/16/21 for the exam, diagnosis, procedures, and orders are all accurate and complete.

## 2022-10-10 IMAGING — RF DG ESOPHAGUS
11 series · 14 of 24 positions shown · non-contrast
Comparison: 08/24/2010

CLINICAL DATA: Shigeharu diverticulum.

EXAM:
ESOPHOGRAM/BARIUM SWALLOW
TECHNIQUE: Combined double contrast and single contrast examination performed
using effervescent crystals, thick barium liquid, and thin barium
liquid. NP.
FLUOROSCOPY TIME:  Fluoroscopy Time:  3 minutes and 42 seconds.
Radiation Exposure Index (if provided by the fluoroscopic device):
17 mGy
Number of Acquired Spot Images:

[Series 1: sequence · 2 of 35 frames shown (1 of 10)]
[frame 3/35]
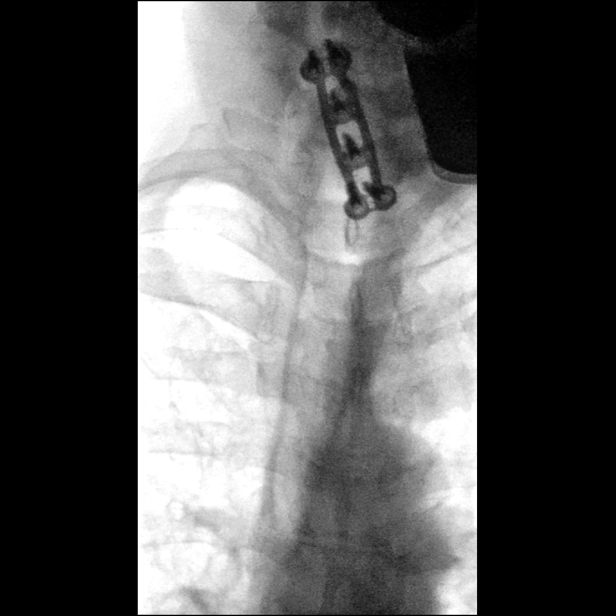
[frame 30/35]
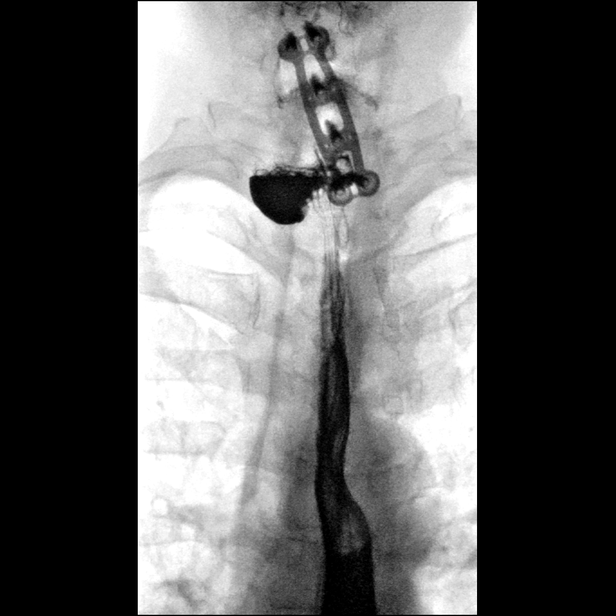

[Series 2: sequence · 1 of 19 frames shown (2 of 10)]
[frame 17/19]
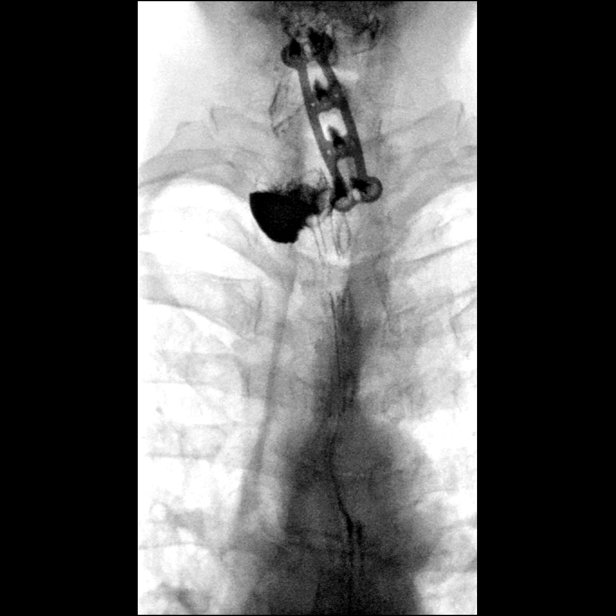

[Series 3: sequence · 1 of 43 frames shown (3 of 10)]
[frame 22/43]
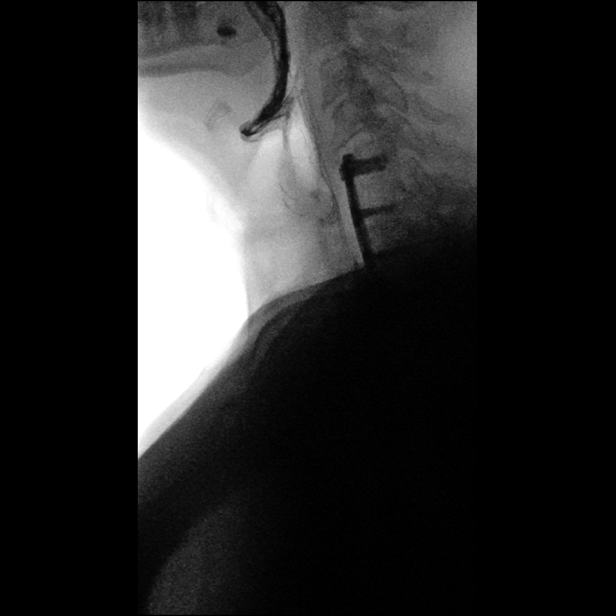

[Series 4: sequence · 1 of 150 frames shown (4 of 10)]
[frame 23/150]
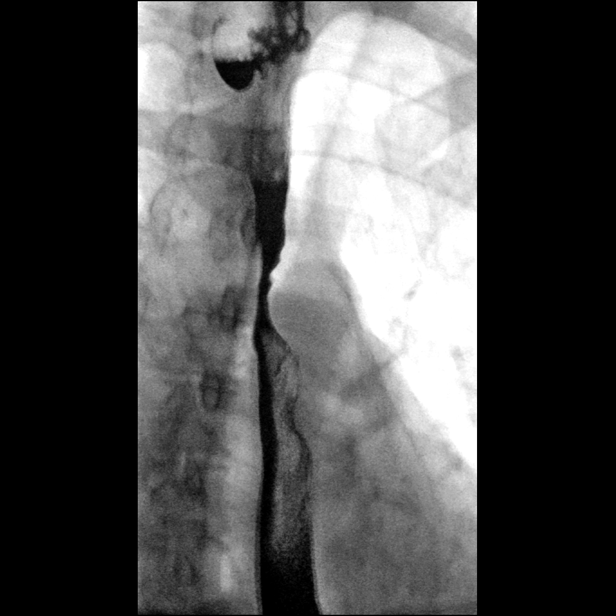

[Series 5: sequence · 2 of 77 frames shown (5 of 10)]
[frame 12/77]
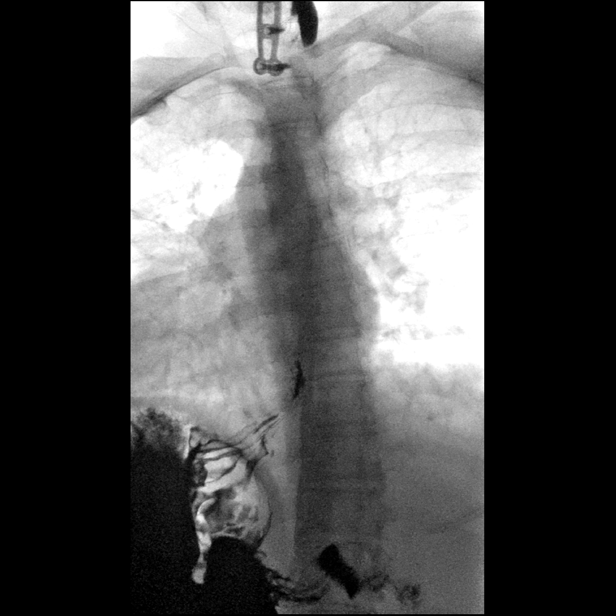
[frame 66/77]
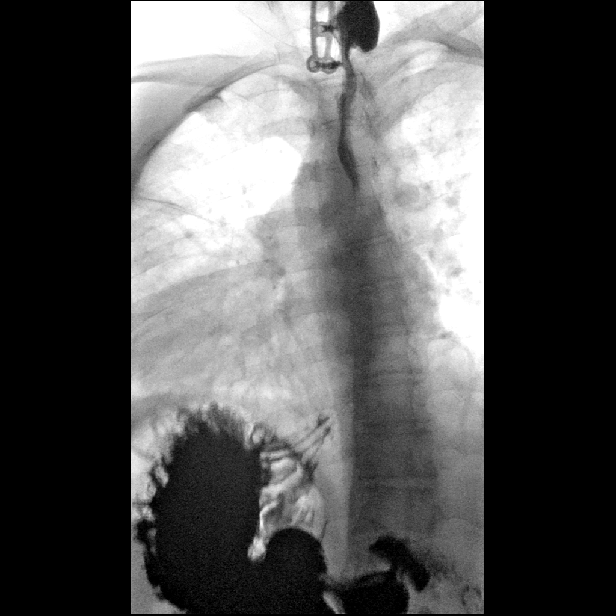

[Series 6: sequence · 1 of 16 frames shown (6 of 10)]
[frame 9/16]
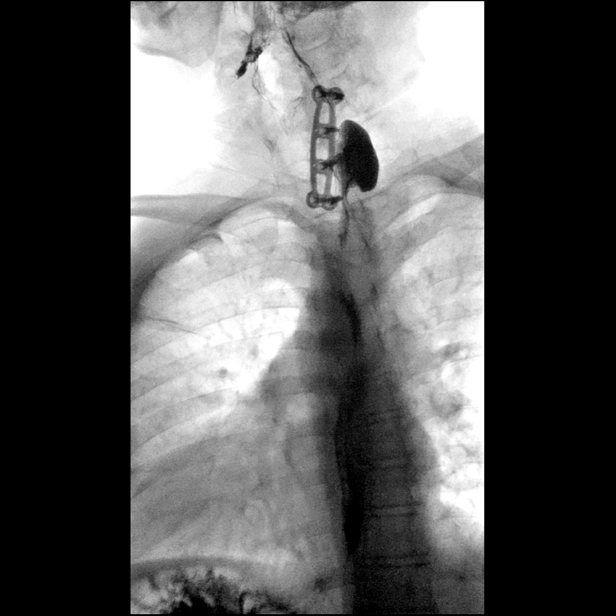

[Series 7: sequence · 1 of 33 frames shown (7 of 10)]
[frame 5/33]
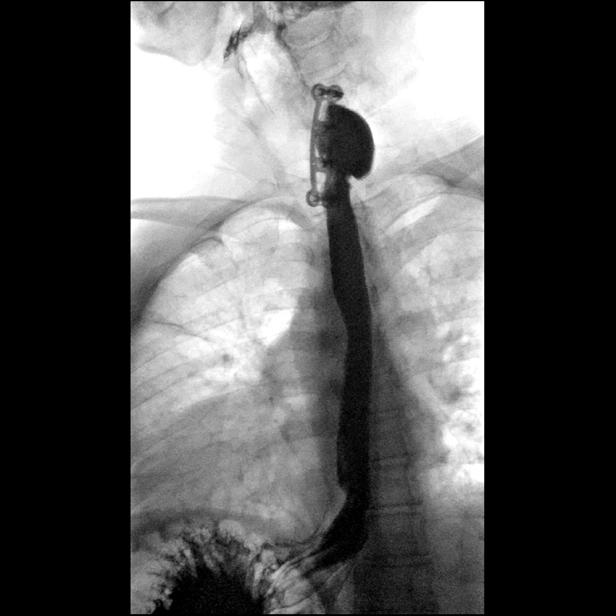

[Series 8: sequence · 2 of 94 frames shown (8 of 10)]
[frame 15/94]
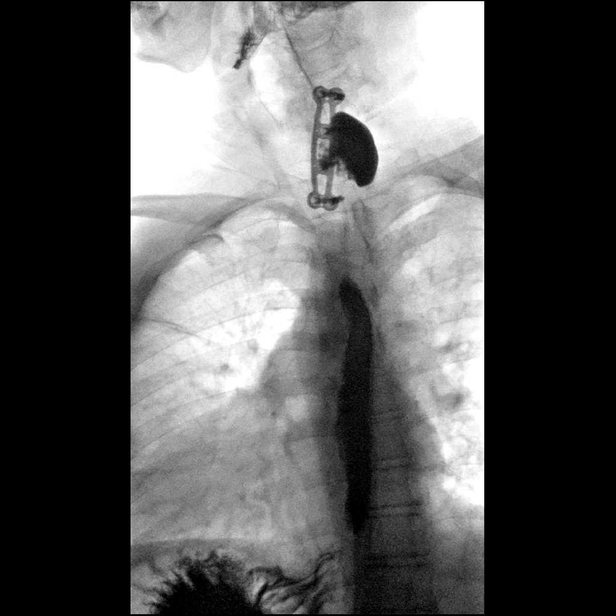
[frame 80/94]
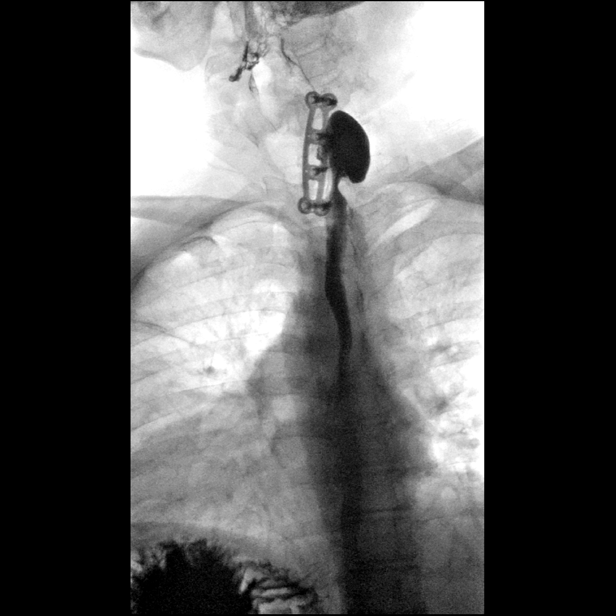

[Series 9: sequence · 1 of 10 frames shown (9 of 10)]
[frame 6/10]
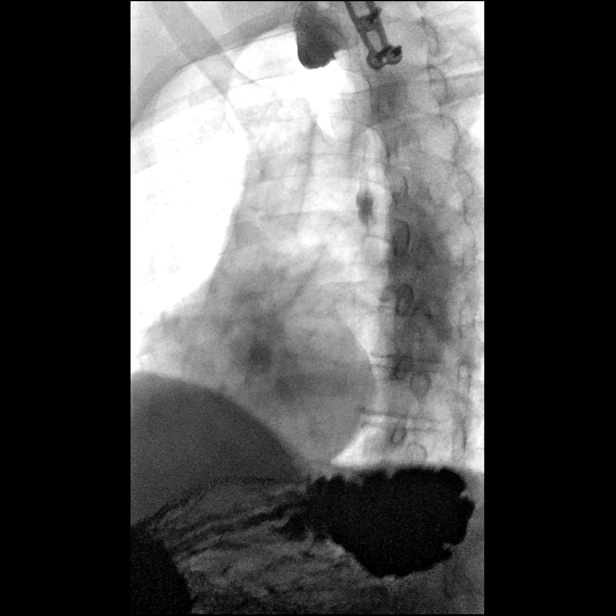

[Series 10: sequence · 1 of 26 frames shown (10 of 10)]
[frame 14/26]
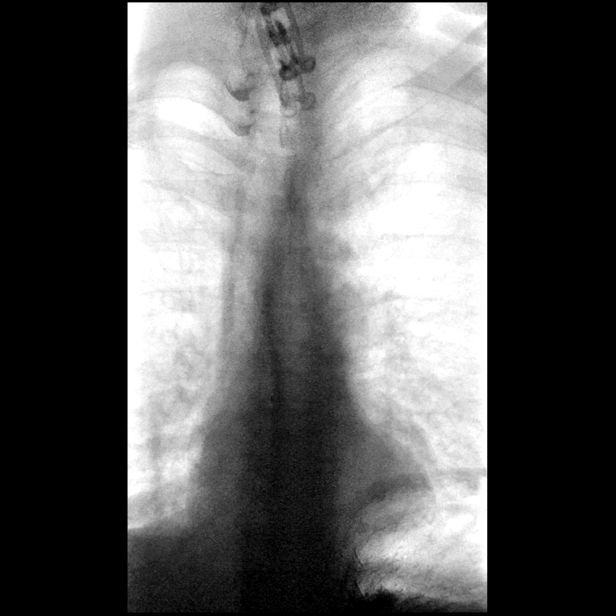

[Series 11: one shot · 1 of 1 slices shown]
[im 1/1]
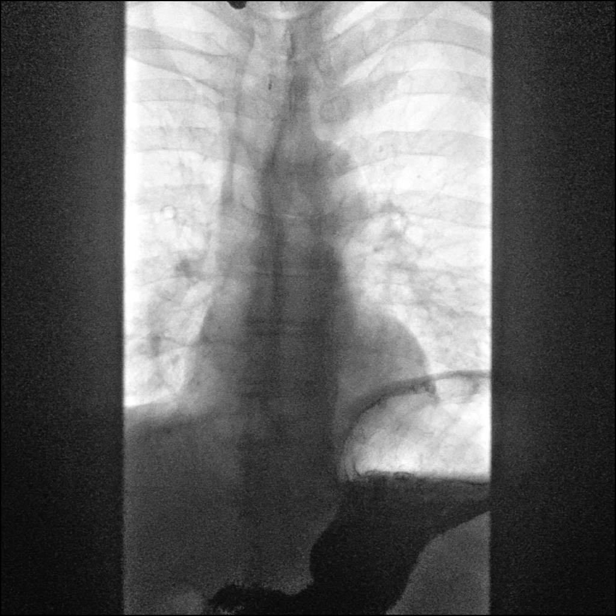

[14 of 24 positions shown; findings below may reference images not displayed]

FINDINGS: Recurrent proximal esophageal diverticulum again identified, similar
in appearance, size, and location to the study of 08/24/2010.
Esophagus otherwise unremarkable in appearance without evidence of
stricture, mass lesion, or gross mucosal ulceration. Tiny hiatal
hernia noted.

Relatively good preservation of primary peristalsis noted with
swallowing. There are some mild tertiary contractions in the distal
esophagus without frank presbyesophagus.
IMPRESSION: Recurrent proximal esophageal diverticulum, similar in size and
position to the study from 08/24/2010.

## 2023-03-18 ENCOUNTER — Encounter: Payer: Self-pay | Admitting: Physician Assistant

## 2023-06-11 ENCOUNTER — Ambulatory Visit: Payer: 59 | Admitting: Physician Assistant

## 2023-06-14 ENCOUNTER — Ambulatory Visit (INDEPENDENT_AMBULATORY_CARE_PROVIDER_SITE_OTHER): Payer: 59 | Admitting: Nurse Practitioner

## 2023-06-14 ENCOUNTER — Encounter: Payer: Self-pay | Admitting: Nurse Practitioner

## 2023-06-14 VITALS — BP 122/72 | HR 76 | Ht 66.0 in | Wt 171.0 lb

## 2023-06-14 DIAGNOSIS — K219 Gastro-esophageal reflux disease without esophagitis: Secondary | ICD-10-CM

## 2023-06-14 DIAGNOSIS — Z860101 Personal history of adenomatous and serrated colon polyps: Secondary | ICD-10-CM | POA: Diagnosis not present

## 2023-06-14 DIAGNOSIS — Z860100 Personal history of colon polyps, unspecified: Secondary | ICD-10-CM

## 2023-06-14 MED ORDER — SUFLAVE 178.7 G PO SOLR: 1.0000 | Freq: Once | ORAL | 0 refills | Status: AC

## 2023-06-14 MED ORDER — SUFLAVE 178.7 G PO SOLR: 1.0000 | Freq: Once | ORAL | 0 refills | Status: DC

## 2023-06-14 NOTE — Progress Notes (Signed)
 06/14/2023 BROADUS COSTILLA [id number] 03-02-1961   CHIEF COMPLAINT: Worsening acid reflux symptoms  HISTORY OF PRESENT ILLNESS: Marcey A.Thurman is a 63 year old male with a past medical history of non-obstructive coronary artery disease per CTA in 2014 with a negative stress test in 2016 and in 2019, GERD, diverticulitis and colon polyps.  He presents today for further evaluation regarding worsening acid reflux symptoms and he also stated his PCP advised for him to schedule a colonoscopy this year.  He endorses having frequent heartburn which occurs only at nighttime.  He describes having acid back up from his esophagus into his throat which has occurred for the past 6 months.  He is frustrated that he is having worsening reflux symptoms despite exercising on a regular basis with associated intentional weight  loss ( 213 lbs - > 171 lbs) and reduced alcohol intake.  He stated his diet is the best it has ever been and he is sleeping much better.  He endorses taking Nexium  40mg  every day for 20 years.  He takes Tagamet 2 tabs at nighttime as needed.  He ate chicken noodle soup with crackers for dinner yesterday evening at 7 PM and went to bed at 10:30 PM and awakened with burning acid in his throat.  No dysphagia.  No abdominal pain.  He takes ASA 81 mg daily.  He rarely takes Aleve.  He sleeps with the head of the bed elevated.  He underwent an EGD 05/14/2008 which showed esophagitis, hiatal hernia and mild gastritis.  He is passing normal formed brown bowel movement daily.  No rectal bleeding or black stools.  He has a history of colon polyps including 1 tubulovillous adenoma per colonoscopy in 2018.  His most recent colonoscopy 10/20/2019 identified one 2 mm polyp removed from the rectum.  He was advised to repeat a colonoscopy in 5 years.  No known family history of colorectal cancer.   He has a history of nonobstructive CAD identified for CTA in 2014.  Negative stress test in 2016 and 2019.  He was last seen  by his [facility] cardiologist 06/11/2023 when his cardiac status was stable at that time and he was instructed to continue Repatha and to continue routine exercise.  He denies having any chest pain, palpitations or shortness of breath.  CARDIAC EVALUATION:   CTA 2014: Left Main 0%, LAD 50-75% mid, CxA 0%, RCA 25-50% osteal  ETT 2016: Negative adequate, Peak HR 164, BP 193/120   STRESS ECHO 2019: Normal wall motion, EF >55%, Peak HR 176, BP 150/80, Mets 13.4, ECG Negative   Labs 05/30/2022: WBC 4.9.  Hemoglobin 15.2.  Hematocrit 45.1.  Platelet 187.  Sodium 137.  Potassium 3.9.  BUN 11.  Creatinine 1.2.  Total bili 0.9.  Alk phos 53.  AST 24.  ALT 27.  TSH 2.25.  PAST GI PROCEDURES:  Colonoscopy 10/20/2019: - The examined portion of the ileum was normal. - One 2 mm polyp in the rectum, removed with a cold biopsy forceps. Resected and retrieved. - Diverticulosis in the sigmoid colon, in the descending colon and in the transverse colon. - Internal hemorrhoids. - 5 Year recall colonoscopy  - DIMINUTIVE HYPERPLASTIC POLYP  Colonoscopy 10/03/2016: - Two 3 to 5 mm polyps in the cecum, removed with a cold snare. Resected and retrieved. - One 15 mm polyp in the descending colon, removed with a hot snare. Resected and retrieved.  - One 4 mm polyp in the descending colon, removed with a cold snare.  Resected and retrieved.  - Moderate diverticulosis in the sigmoid colon and in the ascending colon. Erythema was seen in association with the diverticular opening.  - Non-bleeding internal hemorrhoids.  1. Surgical [P], cecum, polyp (2) - TUBULAR ADENOMA (ONE FRAGMENT). - SESSILE SERRATED ADENOMA (ONE FRAGMENT). - NO HIGH GRADE DYSPLASIA OR MALIGNANCY. 2. Surgical [P], descending, polyp - TUBULOVILLOUS ADENOMA (ONE FRAGMENT). - NO HIGH GRADE DYSPLASIA OR MALIGNANCY. 3. Surgical [P], descending, polyp - BENIGN LYMPHOID POLYP (ONE FRAGMENT). - NO ADENOMATOUS CHANGE OR MALIGNANCY.  EGD 05/14/2008 by Dr. Alm Gander: -Mild gastritis in the body and antrum of the stomach -Hiatal hernia in the cardia -Esophagitis in the distal esophagus Probable atypical GERD STOMACH: MINIMAL CHRONIC GASTRITIS. NO HELICOBACTER PYLORI,   DYSPLASIA OR EVIDENCE OF MALIGNANCY IDENTIFIED.   Colonoscopy 02/05/2006: Erythema present to the ileum to rectum, no polyps. Colon biopsy showed benign colonic mucosa, no significant inflammation or other abnormalities identified thank you.  Have a good day  Colonoscopy 12/24/2000: Erythema present to the ascending and transverse colon, external grade 2 hemorrhoids and no polyps. I. ENDOSCOPIC BIOPSY, RIGHT COLON: ESSENTIALLY UNREMARKABLE   COLONIC MUCOSA, SEE COMMENT.    II. ENDOSCOPIC BIOPSY, LEFT COLON: ESSENTIALLY UNREMARKABLE   COLONIC MUCOSA   COMMENT    I and II. There is no evidence of active or microscopic colitis.   Changes associated with bowel prep are noted and occasional   lymphoid aggregates are present.   EGD 3 cm hiatal hernia 06/03/1992: Mild esophagitis Mild gastritis    Past Medical History:  Diagnosis Date   Contact dermatitis and other eczema, due to unspecified cause    Diverticulitis 2012   Esophageal reflux    Esophagitis    External hemorrhoids without mention of complication    Hiatal hernia    Nonspecific elevation of levels of transaminase or lactic acid dehydrogenase (LDH)    Other and unspecified hyperlipidemia    Other chronic nonalcoholic liver disease    Other symptoms involving head and neck(784.99)    SCCA (squamous cell carcinoma) of skin 09/25/2021   Left Upper Arm Posterior (keratoacanthoma) (tx p bx)   Squamous cell carcinoma of skin 11/18/2013   left forehead scc tx cx3 23fu   Unspecified adverse effect of unspecified drug, medicinal and biological substance    Past Surgical History:  Procedure Laterality Date   ANTERIOR CERVICAL DECOMP/DISCECTOMY FUSION  05/14/2011   Procedure: ANTERIOR CERVICAL  DECOMPRESSION/DISCECTOMY FUSION 3 LEVELS;  Surgeon: Lamar LELON Peaches;  Location: [facility];  Service: Neurosurgery;  Laterality: N/A;  CERVICAL FIVE-SIIX CERVICAL SIX-SEVEN CERVICAL SEVEN THORACIC ONEanterior cervical decompression with fusion plating and bonegraft   CATARACT EXTRACTION, BILATERAL     COLONOSCOPY     LEG SURGERY  2009   R leg repair   WRIST SURGERY     Social History: He is married.  He is an pensions consultant.  He HAS 1 son and 1 daughter.  Non-smoker.  He drinks 2 glasses of wine monthly.  No drug use.  Family History: Father with diabetes.  No known family history of esophageal, gastric or colorectal cancer.   family history includes Diabetes in his father. Allergies  Allergen Reactions   Simvastatin Other (See Comments)    Muscle paralysis (developed rhabdo in 2004)    Lipitor [Atorvastatin]     Muscle aches   Penicillins     Childhood incident   Promethazine     hallucinations   Welchol [Colesevelam Hcl]     GI upset  per patient   Ezetimibe Other (See Comments)    Took in past by [facility], caused some muscle stiffness and GI upset he states   Niacin Other (See Comments)    hot flashes      Outpatient Encounter Medications as of 06/14/2023  Medication Sig   Alirocumab 75 MG/ML SOPN Take 1 tablet by mouth. Injection- taken twice monthly   aspirin 325 MG tablet Take 81 mg by mouth daily.   Coenzyme Q10 (CO Q-10) 200 MG CAPS Take 200 mg by mouth daily.   esomeprazole  (NEXIUM ) 40 MG capsule Take 40 mg by mouth daily at 12 noon.   Methylcellulose, Laxative, 500 MG TABS Take 1 tablet by mouth daily.   Multiple Vitamins-Minerals (CENTRUM SILVER 50+MEN PO) Take by mouth.   No facility-administered encounter medications on file as of 06/14/2023.    REVIEW OF SYSTEMS:  Gen: Denies fever, sweats or chills. No weight loss.  CV: Denies chest pain, palpitations or edema. Resp: Denies cough, shortness of breath of hemoptysis.  GI: See HPI. GU: Denies urinary burning,  blood in urine, increased urinary frequency or incontinence. MS: Denies joint pain, muscles aches or weakness. Derm: Denies rash, itchiness, skin lesions or unhealing ulcers. Psych: Denies depression, anxiety, memory loss or confusion. Heme: Denies bruising, easy bleeding. Neuro:  Denies headaches, dizziness or paresthesias. Endo:  Denies any problems with DM, thyroid or adrenal function.  PHYSICAL EXAM: BP 122/72   Pulse 76   Ht 5' 6 (1.676 m)   Wt 171 lb (77.6 kg)   BMI 27.60 kg/m  Wt Readings from Last 3 Encounters:  06/14/23 171 lb (77.6 kg)  10/20/19 178 lb (80.7 kg)  09/30/19 178 lb (80.7 kg)    General: 63 year old male in no acute distress. Head: Normocephalic and atraumatic. Eyes:  Sclerae non-icteric, conjunctive pink. Ears: Normal auditory acuity. Mouth: Dentition intact. No ulcers or lesions.  Neck: Supple, no lymphadenopathy or thyromegaly.  Lungs: Clear bilaterally to auscultation without wheezes, crackles or rhonchi. Heart: Regular rate and rhythm. No murmur, rub or gallop appreciated.  Abdomen: Soft, nontender, nondistended. No masses. No hepatosplenomegaly. Normoactive bowel sounds x 4 quadrants.  Rectal: Deferred.  Musculoskeletal: Symmetrical with no gross deformities. Skin: Warm and dry. No rash or lesions on visible extremities. Extremities: No edema. Neurological: Alert oriented x 4, no focal deficits.  Psychological:  Alert and cooperative. Normal mood and affect.  ASSESSMENT AND PLAN:  63 year old male with a history of GERD on Nexium  for 20+ years with worsening nighttime reflux for the past 6 months despite diet modification, exercise and intentional weight loss. -Continue Nexium  40 mg 1 capsule to be taken 30 minutes before breakfast -Start Nexium  20 mg 1 capsule to be taken 30 minutes before dinner, patient prefers to take low-dose Nexium  before dinner -EGD scheduled 08/08/2023 to assess for reflux esophagitis and to rule out Barrett's esophagus  benefits and risks discussed including risk with sedation, risk of bleeding, perforation and infection  -Continue GERD diet  History of colon polyps. Colonoscopy 09/2016 identified 4 polyps removed from the colon, including one tubulovillous adenoma and 2 tubular adenomas/sessile serrated polyps and 1 benign lymphoid polyp.  Colonoscopy 10/2019 identified one 2 mm hyperplastic polyp removed from the rectum.  Recall colonoscopy due 10/2024, however, the patient stated his PCP recommended a colonoscopy in 2025. -Colonoscopy tentatively scheduled on the same date of his EGD, however, Dr. Albertus to verify if appropriate to proceed with a colonoscopy earlier than his previously recommended recall date 10/2024.  History of nonobstructive CAD, no angina.        CC:  Darral Franky Dames, MD

## 2023-06-14 NOTE — Patient Instructions (Addendum)
 You have been scheduled for an endoscopy and colonoscopy. Please follow the written instructions given to you at your visit today.  If you use inhalers (even only as needed), please bring them with you on the day of your procedure. _____________________________________________  Rosine will receive your bowel preparation through [facility], which ensures the lowest copay and home delivery, with outreach via text or call from an [phone] number. Please respond promptly to avoid rescheduling of your procedure. If you are interested in alternative options or have any questions regarding your prep, please contact them at [phone] _____________________________________________ Your Provider Has Sent Your Bowel Prep Regimen To [facility]   [facility] will contact you to verify your information and collect your copay, if applicable. Enjoy the comfort of your home while your prescription is mailed to you, FREE of any shipping charges.   [facility] accepts all major insurance benefits and applies discounts & coupons.  Have additional questions?   Chat: [email or web address] Call: [phone] Email: [email or web address] NCPDP: [id number]  How will [facility] contact you?  With a Welcome phone call,  a Welcome text and a checkout link in text form.  Texts you receive from [phone] Are NOT Spam.  *To set up delivery, you must complete the checkout process via link or speak to one of the patient care representatives. If [facility] is unable to reach you, your prescription may be delayed.  To avoid long hold times on the phone, you may also utilize the secure chat feature on the [facility] website to request that they call you back for transaction completion or to expedite your concerns.  Nexium  40 mg- take 1 capsule by mouth to be taken 30 minutes before breakfast  Nexium  20 mg- take 1 by mouth to be taken 30 minutes before dinner  Due to recent changes in healthcare laws, you may see the  results of your imaging and laboratory studies on MyChart before your provider has had a chance to review them.  We understand that in some cases there may be results that are confusing or concerning to you. Not all laboratory results come back in the same time frame and the provider may be waiting for multiple results in order to interpret others.  Please give us  48 hours in order for your provider to thoroughly review all the results before contacting the office for clarification of your results.   Thank you for trusting me with your gastrointestinal care!   Elida Shawl, CRNP

## 2023-06-17 NOTE — Progress Notes (Signed)
 Addendum: Reviewed and agree with assessment and management plan. Ok for EGD/colon Kensleigh Gates, Amber Bail, MD

## 2023-06-17 NOTE — Progress Notes (Signed)
 I called patient and left a message on his mobile voice mail which stated Dr. Bridgett Camps reviewed his office visit 06/13/2023 and agreed with plans for EGD and colonoscopy. Patient to proceed with an EGD and colonoscopy as scheduled 08/08/2023.

## 2023-07-16 ENCOUNTER — Telehealth: Payer: Self-pay | Admitting: Nurse Practitioner

## 2023-07-16 NOTE — Telephone Encounter (Signed)
 Inbound call from patient stating prep medication for upcoming procedures was sent to [facility] and [facility]. States has has been charged by both for medications. Requesting a call back. Please advise, thank you.

## 2023-07-17 NOTE — Telephone Encounter (Signed)
 Contacted patient and patient stated he was angry that he paid for his prep through both [facility] & [facility]. I advised patient to contact [facility] regarding a refund for prep as [facility] would not refund for the prep.

## 2023-07-30 ENCOUNTER — Encounter: Payer: Self-pay | Admitting: Internal Medicine

## 2023-08-08 ENCOUNTER — Ambulatory Visit: Payer: 59 | Admitting: Internal Medicine

## 2023-08-08 ENCOUNTER — Encounter: Payer: Self-pay | Admitting: Internal Medicine

## 2023-08-08 VITALS — BP 126/78 | HR 57 | Temp 97.7°F | Resp 11 | Ht 66.0 in | Wt 171.0 lb

## 2023-08-08 DIAGNOSIS — K449 Diaphragmatic hernia without obstruction or gangrene: Secondary | ICD-10-CM | POA: Diagnosis not present

## 2023-08-08 DIAGNOSIS — K5730 Diverticulosis of large intestine without perforation or abscess without bleeding: Secondary | ICD-10-CM

## 2023-08-08 DIAGNOSIS — K219 Gastro-esophageal reflux disease without esophagitis: Secondary | ICD-10-CM | POA: Diagnosis not present

## 2023-08-08 DIAGNOSIS — Z1211 Encounter for screening for malignant neoplasm of colon: Secondary | ICD-10-CM

## 2023-08-08 DIAGNOSIS — Z860100 Personal history of colon polyps, unspecified: Secondary | ICD-10-CM

## 2023-08-08 DIAGNOSIS — Z9889 Other specified postprocedural states: Secondary | ICD-10-CM | POA: Diagnosis not present

## 2023-08-08 DIAGNOSIS — Z860101 Personal history of adenomatous and serrated colon polyps: Secondary | ICD-10-CM

## 2023-08-08 MED ORDER — SODIUM CHLORIDE 0.9 % IV SOLN: 500.0000 mL | Freq: Once | INTRAVENOUS | Status: DC

## 2023-08-08 NOTE — Op Note (Signed)
 [facility] Patient Name: Reginald House Procedure Date: 08/08/2023 8:47 AM MRN: [id number] Endoscopist: Beverley Fiedler , MD, [id number] Age: 63 Referring MD:  Date of Birth: Aug 28, 1960 Gender: Male Account #: [id number] Procedure:                Colonoscopy Indications:              High risk colon cancer surveillance: Personal                            history of adenoma with villous component, Last                            colonoscopy: June 2021 (no polyps); May 2018 (TVA x                            1 > 1 cm, TA x 1, SSP x 1) Medicines:                Monitored Anesthesia Care Procedure:                Pre-Anesthesia Assessment:                           - Prior to the procedure, a History and Physical                            was performed, and patient medications and                            allergies were reviewed. The patient's tolerance of                            previous anesthesia was also reviewed. The risks                            and benefits of the procedure and the sedation                            options and risks were discussed with the patient.                            All questions were answered, and informed consent                            was obtained. Prior Anticoagulants: The patient has                            taken no anticoagulant or antiplatelet agents. ASA                            Grade Assessment: II - A patient with mild systemic                            disease. After reviewing the risks and benefits,  the patient was deemed in satisfactory condition to                            undergo the procedure.                           After obtaining informed consent, the colonoscope                            was passed under direct vision. Throughout the                            procedure, the patient's blood pressure, pulse, and                            oxygen saturations were monitored  continuously. The                            Olympus Scope SN: [id number] was introduced through                            the anus and advanced to the terminal ileum. The                            colonoscopy was performed without difficulty. The                            patient tolerated the procedure well. The quality                            of the bowel preparation was good. The terminal                            ileum, ileocecal valve, appendiceal orifice, and                            rectum were photographed. Scope In: 9:06:31 AM Scope Out: 9:18:21 AM Scope Withdrawal Time: 0 hours 9 minutes 20 seconds  Total Procedure Duration: 0 hours 11 minutes 50 seconds  Findings:                 The digital rectal exam was normal.                           Multiple medium-mouthed and small-mouthed                            diverticula were found in the sigmoid colon,                            descending colon, proximal transverse colon and                            hepatic flexure.  The exam was otherwise without abnormality on                            direct and retroflexion views. Complications:            No immediate complications. Estimated Blood Loss:     Estimated blood loss: none. Impression:               - Moderate diverticulosis in the sigmoid colon, in                            the descending colon, in the proximal transverse                            colon and at the hepatic flexure.                           - The examination was otherwise normal on direct                            and retroflexion views.                           - No specimens collected. Recommendation:           - Patient has a contact number available for                            emergencies. The signs and symptoms of potential                            delayed complications were discussed with the                            patient. Return to normal activities  tomorrow.                            Written discharge instructions were provided to the                            patient.                           - Resume previous diet.                           - Continue present medications.                           - Repeat colonoscopy in 5 years for surveillance. Beverley Fiedler, MD 08/08/2023 9:40:51 AM This report has been signed electronically.

## 2023-08-08 NOTE — Progress Notes (Signed)
 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  The nature of the procedure, as well as the risks, benefits, and alternatives were carefully and thoroughly reviewed with the patient. Ample time for discussion and questions allowed. The patient understood, was satisfied, and agreed to proceed.     HPI: Reginald House is a 63 y.o. male who presents for EGD and colonoscopy.  Medical history as below.  Tolerated the prep.  No recent chest pain or shortness of breath.  No abdominal pain today.  Past Medical History:  Diagnosis Date   Cataract    Contact dermatitis and other eczema, due to unspecified cause    Diverticulitis 2012   Esophageal reflux    Esophagitis    External hemorrhoids without mention of complication    Hiatal hernia    Nonspecific elevation of levels of transaminase or lactic acid dehydrogenase (LDH)    Other and unspecified hyperlipidemia    Other chronic nonalcoholic liver disease    Other symptoms involving head and neck(784.99)    SCCA (squamous cell carcinoma) of skin 09/25/2021   Left Upper Arm Posterior (keratoacanthoma) (tx p bx)   Squamous cell carcinoma of skin 11/18/2013   left forehead scc tx cx3 68fu   Unspecified adverse effect of unspecified drug, medicinal and biological substance     Past Surgical History:  Procedure Laterality Date   ANTERIOR CERVICAL DECOMP/DISCECTOMY FUSION  05/14/2011   Procedure: ANTERIOR CERVICAL DECOMPRESSION/DISCECTOMY FUSION 3 LEVELS;  Surgeon: Hewitt Shorts;  Location: [facility];  Service: Neurosurgery;  Laterality: N/A;  CERVICAL FIVE-SIIX CERVICAL SIX-SEVEN CERVICAL SEVEN THORACIC ONEanterior cervical decompression with fusion plating and bonegraft    CATARACT EXTRACTION, BILATERAL     COLONOSCOPY     LEG SURGERY  2009   R leg repair   WRIST SURGERY      Prior to Admission medications   Medication Sig Start Date End Date Taking? Authorizing Provider  Alirocumab 75 MG/ML SOPN Take 1 tablet by mouth. Injection- taken twice monthly   Yes [provider]  aspirin 325 MG tablet Take 81 mg by mouth daily.   Yes [provider]  CLOMID 50 MG tablet Take by mouth.   Yes [provider]  esomeprazole (NEXIUM) 40 MG capsule Take 40 mg by mouth daily at 12 noon.   Yes [provider]  Methylcellulose, Laxative, 500 MG TABS Take 1 tablet by mouth daily.   Yes [provider]  Misc Natural Products (BETA-SITOSTEROL PLANT STEROLS PO) once daily Herbal Name: Beta Sitosterol 60mg    Yes [provider]  Psyllium (REGULOID) 400 MG CAPS See admin instructions. 05/07/22  Yes [provider]  REPATHA SURECLICK 140 MG/ML SOAJ Inject 1 mL into the skin every 14 (fourteen) days.   Yes [provider]  Semaglutide,0.25 or 0.5MG /DOS, 2 MG/1.5ML SOPN Inject into the skin. 11/05/22  Yes [provider]    Current Outpatient Medications  Medication Sig Dispense Refill   Alirocumab 75 MG/ML SOPN Take 1 tablet by mouth. Injection- taken twice monthly     aspirin 325 MG tablet Take 81 mg by mouth daily.     CLOMID 50 MG tablet Take by mouth.     esomeprazole (NEXIUM) 40 MG  capsule Take 40 mg by mouth daily at 12 noon.     Methylcellulose, Laxative, 500 MG TABS Take 1 tablet by mouth daily.     Misc Natural Products (BETA-SITOSTEROL PLANT STEROLS PO) once daily Herbal Name: Beta Sitosterol 60mg      Psyllium (REGULOID) 400 MG CAPS See admin instructions.     REPATHA SURECLICK 140 MG/ML SOAJ Inject 1 mL into the skin every 14 (fourteen) days.     Semaglutide,0.25 or 0.5MG /DOS, 2 MG/1.5ML SOPN Inject into the skin.     Current Facility-Administered Medications  Medication Dose Route  Frequency Provider Last Rate Last Admin   0.9 %  sodium chloride infusion  500 mL Intravenous Once Rya Rausch, Carie Caddy, MD        Allergies as of 08/08/2023 - Review Complete 08/08/2023  Allergen Reaction Noted   Lipitor [atorvastatin] Other (See Comments) 01/21/2014   Simvastatin Other (See Comments)    Penicillins Other (See Comments)    Promethazine Other (See Comments) 05/11/2011   Welchol Nolon Lennert hcl] Nausea And Vomiting 01/21/2014   Ezetimibe Other (See Comments)    Niacin Other (See Comments)     Family History  Problem Relation Age of Onset   Diabetes Father    Colon cancer Neg Hx    Esophageal cancer Neg Hx    Rectal cancer Neg Hx    Stomach cancer Neg Hx     Social History   Socioeconomic History   Marital status: Married    Spouse name: Not on file   Number of children: 2   Years of education: Not on file   Highest education level: Not on file  Occupational History   Occupation: attorney  Tobacco Use   Smoking status: Never   Smokeless tobacco: Never  Vaping Use   Vaping status: Never Used  Substance and Sexual Activity   Alcohol use: Yes    Alcohol/week: 1.0 standard drink of alcohol    Types: 1 Glasses of wine per week    Comment: 2 drinks a month   Drug use: No   Sexual activity: Yes  Other Topics Concern   Not on file  Social History Narrative   Not on file   Social Drivers of Health   Financial Resource Strain: Patient Declined (07/16/2023)   Received from [facility]   Overall Financial Resource Strain (CARDIA)    Difficulty of Paying Living Expenses: Patient declined  Food Insecurity: Patient Declined (07/16/2023)   Received from [facility]   Hunger Vital Sign    Worried About Running Out of Food in the Last Year: Patient declined    Ran Out of Food in the Last Year: Patient declined  Transportation Needs: Patient Declined (07/16/2023)   Received from [facility] -  Transportation    In the past 12 months, has lack of transportation kept you from medical appointments or from getting medications?: Patient declined    Lack of Transportation (Non-Medical): Patient declined  Physical Activity: Not on file  Stress: Not on file  Social Connections: Not on file  Intimate Partner Violence: Not on file    Physical Exam: Vital signs in last 24 hours: @BP  125/72   Pulse (!) 57   Temp 97.7 F (36.5 C) (Temporal)   Ht 5\' 6"  (1.676 m)   Wt 171 lb (77.6 kg)   SpO2 96%   BMI 27.60 kg/m  GEN: NAD EYE: Sclerae anicteric ENT: MMM CV: Non-tachycardic Pulm: CTA b/l  GI: Soft, NT/ND NEURO:  Alert & Oriented x 3   Erick Blinks, MD [facility]  08/08/2023 8:50 AM

## 2023-08-08 NOTE — Patient Instructions (Signed)
 Please read handouts provided. Continue present medications. Continue Nexium 40 mg daily. Pepcid ( famotidine ) 20 mg can be added at bedtime for breakthrough symptoms. Resume previous diet. Repeat colonoscopy in 5 years for screening.   YOU HAD AN ENDOSCOPIC PROCEDURE TODAY AT THE [facility]:   Refer to the procedure report that was given to you for any specific questions about what was found during the examination.  If the procedure report does not answer your questions, please call your gastroenterologist to clarify.  If you requested that your care partner not be given the details of your procedure findings, then the procedure report has been included in a sealed envelope for you to review at your convenience later.  YOU SHOULD EXPECT: Some feelings of bloating in the abdomen. Passage of more gas than usual.  Walking can help get rid of the air that was put into your GI tract during the procedure and reduce the bloating. If you had a lower endoscopy (such as a colonoscopy or flexible sigmoidoscopy) you may notice spotting of blood in your stool or on the toilet paper. If you underwent a bowel prep for your procedure, you may not have a normal bowel movement for a few days.  Please Note:  You might notice some irritation and congestion in your nose or some drainage.  This is from the oxygen used during your procedure.  There is no need for concern and it should clear up in a day or so.  SYMPTOMS TO REPORT IMMEDIATELY:  Following lower endoscopy (colonoscopy or flexible sigmoidoscopy):  Excessive amounts of blood in the stool  Significant tenderness or worsening of abdominal pains  Swelling of the abdomen that is new, acute  Fever of 100F or higher  Following upper endoscopy (EGD)  Vomiting of blood or coffee ground material  New chest pain or pain under the shoulder blades  Painful or persistently difficult swallowing  New shortness of breath  Fever of 100F or  higher  Black, tarry-looking stools  For urgent or emergent issues, a gastroenterologist can be reached at any hour by calling [phone]. Do not use MyChart messaging for urgent concerns.    DIET:  We do recommend a small meal at first, but then you may proceed to your regular diet.  Drink plenty of fluids but you should avoid alcoholic beverages for 24 hours.  ACTIVITY:  You should plan to take it easy for the rest of today and you should NOT DRIVE or use heavy machinery until tomorrow (because of the sedation medicines used during the test).    FOLLOW UP: Our staff will call the number listed on your records the next business day following your procedure.  We will call around 7:15- 8:00 am to check on you and address any questions or concerns that you may have regarding the information given to you following your procedure. If we do not reach you, we will leave a message.     If any biopsies were taken you will be contacted by phone or by letter within the next 1-3 weeks.  Please call us at [phone] if you have not heard about the biopsies in 3 weeks.    SIGNATURES/CONFIDENTIALITY: You and/or your care partner have signed paperwork which will be entered into your electronic medical record.  These signatures attest to the fact that that the information above on your After Visit Summary has been reviewed and is understood.  Full responsibility of the confidentiality of this discharge  information lies with you and/or your care-partner.

## 2023-08-08 NOTE — Op Note (Signed)
 [facility] Patient Name: Reginald House Procedure Date: 08/08/2023 8:54 AM MRN: [id number] Endoscopist: Beverley Fiedler , MD, [id number] Age: 63 Referring MD:  Date of Birth: 1960/05/11 Gender: Male Account #: [id number] Procedure:                Upper GI endoscopy Indications:              Gastro-esophageal reflux disease with recent                            nocturnal symptoms Medicines:                Monitored Anesthesia Care Procedure:                Pre-Anesthesia Assessment:                           - Prior to the procedure, a History and Physical                            was performed, and patient medications and                            allergies were reviewed. The patient's tolerance of                            previous anesthesia was also reviewed. The risks                            and benefits of the procedure and the sedation                            options and risks were discussed with the patient.                            All questions were answered, and informed consent                            was obtained. Prior Anticoagulants: The patient has                            taken no anticoagulant or antiplatelet agents. ASA                            Grade Assessment: II - A patient with mild systemic                            disease. After reviewing the risks and benefits,                            the patient was deemed in satisfactory condition to                            undergo the procedure.  After obtaining informed consent, the endoscope was                            passed under direct vision. Throughout the                            procedure, the patient's blood pressure, pulse, and                            oxygen saturations were monitored continuously. The                            Olympus Scope [id number] was introduced through the                            mouth, and advanced to the second part of  duodenum.                            The upper GI endoscopy was accomplished without                            difficulty. The patient tolerated the procedure                            well. Scope In: Scope Out: Findings:                 A previous surgical intervention was found in the                            upper third of the esophagus. Consistent with                            Zenker's repair.                           The exam of the esophagus was otherwise normal.                           A 2 cm hiatal hernia was present.                           The entire examined stomach was normal.                           The examined duodenum was normal. Complications:            No immediate complications. Estimated Blood Loss:     Estimated blood loss: none. Impression:               - A previous surgical intervention was found in the                            proximal esophagus. Surgical clips consistent with  Zenker's repair.                           - 2 cm hiatal hernia.                           - No evidence of Barrett's or esophagitis.                           - Normal stomach.                           - Normal examined duodenum.                           - No specimens collected. Recommendation:           - Patient has a contact number available for                            emergencies. The signs and symptoms of potential                            delayed complications were discussed with the                            patient. Return to normal activities tomorrow.                            Written discharge instructions were provided to the                            patient.                           - Resume previous diet.                           - Continue present medications. Nexium 40 mg daily.                            Famotidine 20 mg can be added at bedtime for                            breakthrough symptoms. Beverley Fiedler,  MD 08/08/2023 9:38:40 AM This report has been signed electronically.

## 2023-08-08 NOTE — Progress Notes (Signed)
To PACU, VSS. Report to [email or web address].tb 

## 2023-08-09 ENCOUNTER — Telehealth: Payer: Self-pay

## 2023-08-09 NOTE — Telephone Encounter (Signed)
 Post procedure follow up call, no answer

## 2023-08-12 ENCOUNTER — Telehealth: Payer: Self-pay | Admitting: Internal Medicine

## 2023-08-12 NOTE — Telephone Encounter (Signed)
 Inbound call from patient requesting to speak with DD about prep medication that was sent to 2 different pharmacies for 4/4 colonoscopy. States he was charged $50 by [employer] and his pharmacy for prep. States he spoke with [facility] but was referred back to our office. States he spoke with DD regarding prep previously.  Patient is requesting a call to speak further.

## 2023-08-12 NOTE — Telephone Encounter (Signed)
 Contacted patient and patient stated that he spoke with [facility] and [facility] stated that they would not issue a refund to the patient.

## 2023-08-13 ENCOUNTER — Encounter: Payer: Self-pay | Admitting: Plastic Surgery

## 2023-08-13 ENCOUNTER — Ambulatory Visit: Payer: 59 | Admitting: Plastic Surgery

## 2023-08-13 VITALS — BP 111/76 | HR 61 | Ht 66.0 in | Wt 175.8 lb

## 2023-08-13 DIAGNOSIS — L723 Sebaceous cyst: Secondary | ICD-10-CM

## 2023-08-13 NOTE — Progress Notes (Signed)
 Patient ID: Reginald House, male    DOB: 10-16-1960, 63 y.o.   MRN: [id number]   Chief Complaint  Patient presents with   Advice Only    The patient is a 63 year old male here for evaluation of his face.  He has an area on his left cheek that abuts his nose.  He has had a cyst there for years.  It seems to drain and then closes up for a little while.  Recently it has been draining and irritated.  It does look like a sebaceous cyst.  It is slightly irritated but not red and not infected.  It is likely up at least 5 mm in size.  He was seen by dermatologist who recommended that he go ahead and get it excised.  He also asked about his neck region as he has lost about 50 pounds in the last couple of years and is noticing excess skin around the neck.    Review of Systems  Constitutional: Negative.   HENT: Negative.    Eyes: Negative.   Respiratory: Negative.    Cardiovascular: Negative.   Gastrointestinal: Negative.   Endocrine: Negative.   Genitourinary: Negative.   Musculoskeletal: Negative.     Past Medical History:  Diagnosis Date   Cataract    Contact dermatitis and other eczema, due to unspecified cause    Diverticulitis 2012   Esophageal reflux    Esophagitis    External hemorrhoids without mention of complication    Hiatal hernia    Nonspecific elevation of levels of transaminase or lactic acid dehydrogenase (LDH)    Other and unspecified hyperlipidemia    Other chronic nonalcoholic liver disease    Other symptoms involving head and neck(784.99)    SCCA (squamous cell carcinoma) of skin 09/25/2021   Left Upper Arm Posterior (keratoacanthoma) (tx p bx)   Squamous cell carcinoma of skin 11/18/2013   left forehead scc tx cx3 60fu   Unspecified adverse effect of unspecified drug, medicinal and biological substance     Past Surgical History:  Procedure Laterality Date   ANTERIOR CERVICAL DECOMP/DISCECTOMY FUSION  05/14/2011   Procedure: ANTERIOR CERVICAL  DECOMPRESSION/DISCECTOMY FUSION 3 LEVELS;  Surgeon: Hewitt Shorts;  Location: [facility];  Service: Neurosurgery;  Laterality: N/A;  CERVICAL FIVE-SIIX CERVICAL SIX-SEVEN CERVICAL SEVEN THORACIC ONEanterior cervical decompression with fusion plating and bonegraft   CATARACT EXTRACTION, BILATERAL     COLONOSCOPY     LEG SURGERY  2009   R leg repair   WRIST SURGERY        Current Outpatient Medications:    aspirin 325 MG tablet, Take 81 mg by mouth daily., Disp: , Rfl:    CLOMID 50 MG tablet, Take by mouth., Disp: , Rfl:    esomeprazole (NEXIUM) 40 MG capsule, Take 40 mg by mouth daily at 12 noon., Disp: , Rfl:    Methylcellulose, Laxative, 500 MG TABS, Take 1 tablet by mouth daily., Disp: , Rfl:    Misc Natural Products (BETA-SITOSTEROL PLANT STEROLS PO), once daily Herbal Name: Beta Sitosterol 60mg , Disp: , Rfl:    Psyllium (REGULOID) 400 MG CAPS, See admin instructions., Disp: , Rfl:    REPATHA SURECLICK 140 MG/ML SOAJ, Inject 1 mL into the skin every 14 (fourteen) days., Disp: , Rfl:    Semaglutide,0.25 or 0.5MG /DOS, 2 MG/1.5ML SOPN, Inject into the skin., Disp: , Rfl:    Objective:   Vitals:   08/13/23 1338  BP: 111/76  Pulse: 61  SpO2:  98%    Physical Exam Constitutional:      Appearance: Normal appearance.  Cardiovascular:     Rate and Rhythm: Normal rate.     Pulses: Normal pulses.  Pulmonary:     Effort: Pulmonary effort is normal.  Abdominal:     Palpations: Abdomen is soft.  Neurological:     Mental Status: He is oriented to person, place, and time.  Psychiatric:        Mood and Affect: Mood normal.        Behavior: Behavior normal.        Thought Content: Thought content normal.        Judgment: Judgment normal.     Assessment & Plan:  Sebaceous cyst  Plan for excision of left cheek sebaceous cyst and I will send him information on the halo for his face and neck.  Alena Bills Alastor Kneale, DO

## 2023-10-29 ENCOUNTER — Ambulatory Visit: Admitting: Plastic Surgery

## 2023-10-29 ENCOUNTER — Other Ambulatory Visit (HOSPITAL_COMMUNITY)
Admission: RE | Admit: 2023-10-29 | Discharge: 2023-10-29 | Disposition: A | Discharge status: Home / Self Care | Source: Ambulatory Visit | Attending: Plastic Surgery | Admitting: Plastic Surgery

## 2023-10-29 ENCOUNTER — Encounter: Payer: Self-pay | Admitting: Plastic Surgery

## 2023-10-29 VITALS — BP 111/73 | HR 69 | Ht 67.0 in

## 2023-10-29 DIAGNOSIS — L720 Epidermal cyst: Secondary | ICD-10-CM

## 2023-10-29 DIAGNOSIS — L723 Sebaceous cyst: Secondary | ICD-10-CM | POA: Diagnosis present

## 2023-10-29 NOTE — Progress Notes (Signed)
 Procedure Note  Preoperative Dx: sebaceous cyst of left cheek  Postoperative Dx: Same  Procedure: Excision of sebaceous cyst left cheek 5 mm  Anesthesia: Lidocaine  1% with 1:100,000 epinephrine   Indication for Procedure: cyst  Description of Procedure: Risks and complications were explained to the patient.  Consent was confirmed and the patient understands the risks and benefits.  The potential complications and alternatives were explained and the patient consents.  The patient expressed understanding the option of not having the procedure and the risks of a scar.  Time out was called and all information was confirmed to be correct.    The area was prepped and drapped.  Lidocaine  1% with epinephrine  was injected in the subcutaneous area.  After waiting several minutes for the local to take affect a #15 blade was used to excise the area in an eliptical pattern.  There was mostly scar tissue noted. The skin edges were reapproximated with 5-0 Monocryl.  A dressing was applied.  The patient was given instructions on how to care for the area and a follow up appointment.  Reginald House tolerated the procedure well and there were no complications. The specimen was sent to pathology.

## 2023-11-01 LAB — SURGICAL PATHOLOGY

## 2023-11-04 NOTE — Progress Notes (Unsigned)
 Patient is a pleasant 63 year old male s/p excision of cystic lesion left cheek performed 10/29/2023 Dr. Lowery who presents to clinic for postprocedural follow-up.  Reviewed procedure note and the excision site was closed with 5-0 Monocryl.  Reviewed pathology and findings were consistent with epidermal inclusion cyst.  Today,

## 2023-11-05 ENCOUNTER — Ambulatory Visit (INDEPENDENT_AMBULATORY_CARE_PROVIDER_SITE_OTHER): Admitting: Physician Assistant

## 2023-11-05 VITALS — BP 122/71 | HR 65

## 2023-11-05 DIAGNOSIS — L723 Sebaceous cyst: Secondary | ICD-10-CM

## 2023-11-05 DIAGNOSIS — Z719 Counseling, unspecified: Secondary | ICD-10-CM
